# Patient Record
Sex: Female | Born: 1945
Health system: Southern US, Community
[De-identification: ages and names within clinical notes are randomized; demographics above are authoritative.]

## PROBLEM LIST (undated history)

## (undated) DIAGNOSIS — H409 Unspecified glaucoma: Secondary | ICD-10-CM

## (undated) DIAGNOSIS — R079 Chest pain, unspecified: Secondary | ICD-10-CM

## (undated) DIAGNOSIS — M810 Age-related osteoporosis without current pathological fracture: Secondary | ICD-10-CM

## (undated) DIAGNOSIS — M1990 Unspecified osteoarthritis, unspecified site: Secondary | ICD-10-CM

## (undated) DIAGNOSIS — M4150 Other secondary scoliosis, site unspecified: Secondary | ICD-10-CM

## (undated) DIAGNOSIS — E039 Hypothyroidism, unspecified: Secondary | ICD-10-CM

## (undated) DIAGNOSIS — Z86010 Personal history of colonic polyps: Secondary | ICD-10-CM

## (undated) DIAGNOSIS — E349 Endocrine disorder, unspecified: Secondary | ICD-10-CM

## (undated) DIAGNOSIS — M4800 Spinal stenosis, site unspecified: Secondary | ICD-10-CM

## (undated) DIAGNOSIS — I10 Essential (primary) hypertension: Secondary | ICD-10-CM

## (undated) DIAGNOSIS — H269 Unspecified cataract: Secondary | ICD-10-CM

## (undated) HISTORY — DX: Other secondary scoliosis, site unspecified: M41.50

## (undated) HISTORY — PX: TUBAL LIGATION: SHX77

## (undated) HISTORY — DX: Endocrine disorder, unspecified: E34.9

## (undated) HISTORY — DX: Unspecified osteoarthritis, unspecified site: M19.90

## (undated) HISTORY — DX: Essential (primary) hypertension: I10

## (undated) HISTORY — DX: Hypothyroidism, unspecified: E03.9

## (undated) HISTORY — DX: Chest pain, unspecified: R07.9

## (undated) HISTORY — DX: Unspecified glaucoma: H40.9

## (undated) HISTORY — DX: Spinal stenosis, site unspecified: M48.00

## (undated) HISTORY — DX: Age-related osteoporosis without current pathological fracture: M81.0

## (undated) HISTORY — DX: Unspecified cataract: H26.9

## (undated) HISTORY — DX: Personal history of colonic polyps: Z86.010

## (undated) HISTORY — PX: OTHER SURGICAL HISTORY: SHX169

## (undated) HISTORY — PX: TYMPANOSTOMY TUBE PLACEMENT: SHX32

---

## 1998-12-16 ENCOUNTER — Other Ambulatory Visit: Admission: RE | Admit: 1998-12-16 | Discharge: 1998-12-16 | Payer: Self-pay | Admitting: Obstetrics and Gynecology

## 1999-10-25 ENCOUNTER — Encounter: Admission: RE | Admit: 1999-10-25 | Discharge: 1999-10-25 | Payer: Self-pay | Admitting: Obstetrics and Gynecology

## 1999-10-25 ENCOUNTER — Encounter: Payer: Self-pay | Admitting: Obstetrics and Gynecology

## 2000-01-03 ENCOUNTER — Other Ambulatory Visit: Admission: RE | Admit: 2000-01-03 | Discharge: 2000-01-03 | Payer: Self-pay | Admitting: Obstetrics and Gynecology

## 2000-10-26 ENCOUNTER — Encounter: Admission: RE | Admit: 2000-10-26 | Discharge: 2000-10-26 | Payer: Self-pay | Admitting: Obstetrics and Gynecology

## 2000-10-26 ENCOUNTER — Encounter: Payer: Self-pay | Admitting: Obstetrics and Gynecology

## 2001-02-21 ENCOUNTER — Other Ambulatory Visit: Admission: RE | Admit: 2001-02-21 | Discharge: 2001-02-21 | Payer: Self-pay | Admitting: Obstetrics and Gynecology

## 2001-11-28 ENCOUNTER — Encounter: Admission: RE | Admit: 2001-11-28 | Discharge: 2001-11-28 | Payer: Self-pay | Admitting: Obstetrics and Gynecology

## 2001-11-28 ENCOUNTER — Encounter: Payer: Self-pay | Admitting: Obstetrics and Gynecology

## 2002-02-25 ENCOUNTER — Other Ambulatory Visit: Admission: RE | Admit: 2002-02-25 | Discharge: 2002-02-25 | Payer: Self-pay | Admitting: Obstetrics and Gynecology

## 2002-03-10 ENCOUNTER — Encounter: Payer: Self-pay | Admitting: Obstetrics and Gynecology

## 2002-03-10 ENCOUNTER — Encounter: Admission: RE | Admit: 2002-03-10 | Discharge: 2002-03-10 | Payer: Self-pay | Admitting: Obstetrics and Gynecology

## 2002-08-14 ENCOUNTER — Ambulatory Visit (HOSPITAL_BASED_OUTPATIENT_CLINIC_OR_DEPARTMENT_OTHER): Admission: RE | Admit: 2002-08-14 | Discharge: 2002-08-14 | Payer: Self-pay | Admitting: Orthopedic Surgery

## 2002-11-25 ENCOUNTER — Ambulatory Visit (HOSPITAL_BASED_OUTPATIENT_CLINIC_OR_DEPARTMENT_OTHER): Admission: RE | Admit: 2002-11-25 | Discharge: 2002-11-25 | Payer: Self-pay | Admitting: Orthopedic Surgery

## 2003-02-03 ENCOUNTER — Encounter: Admission: RE | Admit: 2003-02-03 | Discharge: 2003-02-03 | Payer: Self-pay | Admitting: Obstetrics and Gynecology

## 2003-02-03 ENCOUNTER — Encounter: Payer: Self-pay | Admitting: Obstetrics and Gynecology

## 2003-05-12 ENCOUNTER — Other Ambulatory Visit: Admission: RE | Admit: 2003-05-12 | Discharge: 2003-05-12 | Payer: Self-pay | Admitting: Obstetrics and Gynecology

## 2003-11-12 ENCOUNTER — Emergency Department (HOSPITAL_COMMUNITY): Admission: EM | Admit: 2003-11-12 | Discharge: 2003-11-12 | Payer: Self-pay | Admitting: Emergency Medicine

## 2004-02-25 ENCOUNTER — Encounter: Admission: RE | Admit: 2004-02-25 | Discharge: 2004-02-25 | Payer: Self-pay | Admitting: Obstetrics and Gynecology

## 2005-04-06 ENCOUNTER — Encounter: Admission: RE | Admit: 2005-04-06 | Discharge: 2005-04-06 | Payer: Self-pay | Admitting: Obstetrics and Gynecology

## 2006-05-23 ENCOUNTER — Encounter: Admission: RE | Admit: 2006-05-23 | Discharge: 2006-05-23 | Payer: Self-pay | Admitting: Obstetrics and Gynecology

## 2007-06-20 HISTORY — PX: COLONOSCOPY: SHX174

## 2007-06-24 ENCOUNTER — Encounter: Admission: RE | Admit: 2007-06-24 | Discharge: 2007-06-24 | Payer: Self-pay | Admitting: Obstetrics and Gynecology

## 2007-11-12 ENCOUNTER — Ambulatory Visit: Payer: Self-pay | Admitting: Internal Medicine

## 2007-11-25 ENCOUNTER — Ambulatory Visit: Payer: Self-pay | Admitting: Internal Medicine

## 2008-08-14 ENCOUNTER — Encounter: Admission: RE | Admit: 2008-08-14 | Discharge: 2008-08-14 | Payer: Self-pay | Admitting: Obstetrics and Gynecology

## 2008-12-08 ENCOUNTER — Encounter: Payer: Self-pay | Admitting: Cardiology

## 2009-04-08 ENCOUNTER — Encounter: Payer: Self-pay | Admitting: Internal Medicine

## 2009-05-26 ENCOUNTER — Ambulatory Visit: Payer: Self-pay | Admitting: Cardiology

## 2009-05-26 DIAGNOSIS — E039 Hypothyroidism, unspecified: Secondary | ICD-10-CM

## 2009-05-26 DIAGNOSIS — R079 Chest pain, unspecified: Secondary | ICD-10-CM

## 2009-05-26 HISTORY — DX: Chest pain, unspecified: R07.9

## 2009-05-26 HISTORY — DX: Hypothyroidism, unspecified: E03.9

## 2009-05-28 ENCOUNTER — Telehealth: Payer: Self-pay | Admitting: Cardiology

## 2009-06-16 ENCOUNTER — Ambulatory Visit: Payer: Self-pay | Admitting: Cardiovascular Disease

## 2009-06-16 ENCOUNTER — Ambulatory Visit: Payer: Self-pay

## 2009-06-16 ENCOUNTER — Ambulatory Visit (HOSPITAL_COMMUNITY): Admission: RE | Admit: 2009-06-16 | Discharge: 2009-06-16 | Payer: Self-pay | Admitting: Cardiology

## 2009-06-16 ENCOUNTER — Encounter: Payer: Self-pay | Admitting: Cardiology

## 2009-10-12 ENCOUNTER — Encounter: Admission: RE | Admit: 2009-10-12 | Discharge: 2009-10-12 | Payer: Self-pay | Admitting: Obstetrics and Gynecology

## 2010-02-28 ENCOUNTER — Encounter: Payer: Self-pay | Admitting: Cardiology

## 2010-07-19 NOTE — Procedures (Signed)
Summary: Colonosocpy   Colonoscopy  Procedure date:  11/25/2007  Findings:      Location:  [facility].    Procedures Next Due Date:    Colonoscopy: 11/2017  Patient Name: Alicia Cordova, Alicia Cordova MRN: [id number] Procedure Procedures: Colonoscopy CPT: 40981.  Personnel: Endoscopist: Iva Boop, MD, [facility].  Exam Location: Exam performed in Outpatient Clinic. Outpatient  Patient Consent: Procedure, Alternatives, Risks and Benefits discussed, consent obtained, from patient. Consent was obtained by the RN.  Indications  Average Risk Screening Routine.  History  Current Medications: Patient is not currently taking Coumadin.  Allergies: No known allergies.  Pre-Exam Physical: Performed Nov 25, 2007. Cardio-pulmonary exam, Rectal exam, HEENT exam , Abdominal exam, Mental status exam WNL.  Comments: Pt. history reviewed/updated, physical exam performed prior to initiation of sedation? YES Exam Exam: Extent of exam reached: Cecum, extent intended: Cecum.  The cecum was identified by appendiceal orifice and IC valve. Patient position: on left side. Time to Cecum: 00:03:28. Time for Withdrawl: 00:09:36. Colon retroflexion performed. Images taken. ASA Classification: II. Tolerance: excellent.  Monitoring: Pulse and BP monitoring, Oximetry used. Supplemental O2 given.  Colon Prep Used MiraLax for colon prep. Prep results: excellent.  Sedation Meds: Patient assessed and found to be appropriate for moderate (conscious) sedation. Fentanyl 50 mcg. given IV. Versed 5 mg. given IV.  Findings - NORMAL EXAM: Cecum to Sigmoid Colon.  DIVERTICULOSIS: Sigmoid Colon. Comments: minimal.  HEMORRHOIDS: External. Size: Grade I. Comments: very small.   Assessment  Comments: 1) MINIMAL SIGMOID DIVERTICULOSIS 2) VERY SMALL HEMORRHOIDS 3) OTHERWISE NORMAL WITH EXCELLENT PREP Events  Unplanned Interventions: No intervention was required.  Plans Patient Education:  Patient given standard instructions for: Diverticulosis. Hemorrhoids.  Disposition: After procedure patient sent to recovery. After recovery patient sent home.  Scheduling/Referral: Colonoscopy, ROUTINE IN 10 YRS,     cc.   Gretta Arab Griffin,MD   This report was created from the original endoscopy report, which was reviewed and signed by the above listed endoscopist.

## 2010-07-19 NOTE — Miscellaneous (Signed)
Summary: [facility] Previsit  Clinical Lists Changes  Medications: Added new medication of MIRALAX   POWD (POLYETHYLENE GLYCOL 3350) As per prep  instructions. - Signed Added new medication of METOCLOPRAMIDE HCL 10 MG  TABS (METOCLOPRAMIDE HCL) As per prep instructions. - Signed Rx of MIRALAX   POWD (POLYETHYLENE GLYCOL 3350) As per prep  instructions.;  #255gm x 0;  Signed;  Entered by: Wyona Almas RN;  Authorized by: Iva Boop MD;  Method used: Electronic Rx of METOCLOPRAMIDE HCL 10 MG  TABS (METOCLOPRAMIDE HCL) As per prep instructions.;  #2 x 0;  Signed;  Entered by: Wyona Almas RN;  Authorized by: Iva Boop MD;  Method used: Electronic Observations: Added new observation of NKA: T (11/12/2007 8:05)    Prescriptions: METOCLOPRAMIDE HCL 10 MG  TABS (METOCLOPRAMIDE HCL) As per prep instructions.  #2 x 0   Entered by:   Wyona Almas RN   Authorized by:   Iva Boop MD   Signed by:   Wyona Almas RN on 11/12/2007   Method used:   Electronically sent to ...       [facility]*       [address]       [place], [place]  [address]       Ph: [phone] or [phone]       Fax: [phone]   RxID:   [id number] MIRALAX   POWD (POLYETHYLENE GLYCOL 3350) As per prep  instructions.  #255gm x 0   Entered by:   Wyona Almas RN   Authorized by:   Iva Boop MD   Signed by:   Wyona Almas RN on 11/12/2007   Method used:   Electronically sent to ...       [facility]*       [address]       [place], [place]  [address]       Ph: [phone] or [phone]       Fax: [phone]   RxID:   [id number]

## 2010-07-19 NOTE — Assessment & Plan Note (Signed)
Summary: np6   Visit Type:  Initial Consult Primary Provider:  Maurice Small,  MD  CC:  chest pain.  History of Present Illness: the patient is a very healthy 65 year old female.  She is very active.  She exercises regularly.  She has a family history of coronary disease but not at a young age.  Her father had CABG at a later age.  She does not have any significant lipid abnormalities.  We are awaiting labs from her primary physician.  She is hypothyroid and takes medication.  Patient exercises every day.  She is in excellent physical condition.  Over time she has had episodes of chest discomfort.  They are random.  However when they occur, the symptoms can persist for 15-30 minutes.  The exact etiology is not clear.  There is no nausea vomiting or diaphoresis.  Preventive Screening-Counseling & Management  Alcohol-Tobacco     Smoking Status: never  Caffeine-Diet-Exercise     Does Patient Exercise: yes      Drug Use:  no.    Current Medications (verified): 1)  Synthroid 88 Mcg Tabs (Levothyroxine Sodium) .... Daily -- 6 Days A Week 2)  Multivitamins   Tabs (Multiple Vitamin) .... Once Daily 3)  Vitamin D 1000 Unit  Tabs (Cholecalciferol) .... Once Daily 4)  Fish Oil   Oil (Fish Oil) .... Once Daily 5)  Aspirin 81 Mg  Tabs (Aspirin) .... Once Daily  Allergies (verified): No Known Drug Allergies  Past History:  Family History: Last updated: 05/26/2009 Family History of Coronary Artery Disease:  Family History of Hyperlipidemia:  Family History of Hypertension:   Social History: Last updated: 05/26/2009 Full Time Single  Tobacco Use - No.  Alcohol Use - yes Regular Exercise - yes Drug Use - no  Past Medical History: CHEST PAIN-UNSPECIFIED (ICD-786.50)  hypothyroidism  Past Surgical History: wrist -- has a plate   Family History: Family History of Coronary Artery Disease:  Family History of Hyperlipidemia:  Family History of Hypertension:   Social History:  Full Time Single  Tobacco Use - No.  Alcohol Use - yes Regular Exercise - yes Drug Use - no Smoking Status:  never Does Patient Exercise:  yes Drug Use:  no  Review of Systems       The patient denies fever, chills, headache, sweats, rash, change in vision, change in hearing, shortness of breath, cough, nausea vomiting, urinary symptoms, musculoskeletal problems.  All of the systems are reviewed and are negative.  Vital Signs:  Patient profile:   65 year old female Height:      62 inches Weight:      128 pounds BMI:     23.50 Pulse rate:   60 / minute BP sitting:   132 / 72  (left arm) Cuff size:   regular  Vitals Entered By: Hardin Negus, RMA (May 26, 2009 4:20 PM)  Physical Exam  General:  patient is quite healthy in general Head:  head is atraumatic. Eyes:  no xanthelasma. Neck:  no jugular venous distention.  No carotid bruits. Chest Wall:  no chest wall tenderness. Lungs:  lungs are clear respiratory effort is nonlabored. Heart:  cardiac exam reveals S1-S2.  There no clicks or significant murmurs. Abdomen:  abdomen is soft. Msk:  no musculoskeletal deformities. Extremities:  no peripheral edema. Skin:  no skin rashes. Psych:  patient is oriented to person time and place.  Affect is normal.   Impression & Recommendations:  Problem # 1:  CHEST  PAIN-UNSPECIFIED (ICD-786.50)  Her updated medication list for this problem includes:    Aspirin 81 Mg Tabs (Aspirin) ..... Once daily  Orders: EKG w/ Interpretation (93000) Stress Echo (Stress Echo) Echocardiogram (Echo)  The patient has had chest discomfort.  EKG is done today and reviewed by me.  There is normal sinus rhythm with a normal QRS.  There is suggestion of biatrial enlargement by EKG criteria.  At this point further evaluation will include a full echo along with stress echo.  This will help me assess LV function and her bowels and her atria.  In addition we will rule out possible ischemia as the  basis of her chest pain.  I will not be starting any medications.  Problem # 2:  HYPOTHYROIDISM (ICD-244.9)  Her updated medication list for this problem includes:    Synthroid 88 Mcg Tabs (Levothyroxine sodium) .Marland Kitchen... Daily -- 6 days a week The patient is treated for her hypothyroidism.  No further workup is needed.  Problem # 3:  * BIATRIAL ENLARGEMENT BY EKG On physical exam I do not hear any valvular abnormalities.  Echo will be done to assess further.  Patient Instructions: 1)  Your physician has requested that you have an echocardiogram.  Echocardiography is a painless test that uses sound waves to create images of your heart. It provides your doctor with information about the size and shape of your heart and how well your heart's chambers and valves are working.  This procedure takes approximately one hour. There are no restrictions for this procedure. 2)  Your physician has requested that you have a stress echocardiogram. For further information please visit [email or web address]  Please follow instruction sheet as given. 3)  We will be in touch with via phone with your test results.

## 2010-07-19 NOTE — Miscellaneous (Signed)
  Clinical Lists Changes  Observations: Added new observation of PRIMARY MD: Maurice Small,  MD (05/26/2009 13:45)

## 2010-07-19 NOTE — Miscellaneous (Signed)
  Clinical Lists Changes  Observations: Added new observation of CARDIO HPI: labs sent to [place] by Dr.Dicktein:  collected October, 2010  Total cholesterol 190 LDL                     104 HDL                     79 Triglycerides       34 LDL particles      1007    this is low in good HDL particle size  35      this is high in good Small LDL             95      this is low in good LDL size              21.2    this is relatively large and relatively good  Overall these lipids are excellent.  Patient has no proven coronary disease.  No further treatment he (02/28/2010 15:39) Added new observation of VISIT TYPE: Chart update (02/28/2010 15:39) Added new observation of PRIMARY MD: Maurice Small,  MD (02/28/2010 15:39)      Visit Type:  Chart update Primary Provider:  Maurice Small,  MD   History of Present Illness: labs sent to [place] by Dr.Dicktein:  collected October, 2010  Total cholesterol 190 LDL                     104 HDL                     79 Triglycerides       34 LDL particles      1007    this is low in good HDL particle size  35      this is high in good Small LDL             95      this is low in good LDL size              21.2    this is relatively large and relatively good  Overall these lipids are excellent.  Patient has no proven coronary disease.  No further treatment he

## 2010-07-19 NOTE — Progress Notes (Signed)
Summary: returning call  Phone Note Call from Patient Call back at Work Phone [phone]   Caller: Patient Reason for Call: Talk to Nurse Summary of Call: returning call.... aware Herbert Seta is out till Monday, request call on Monday Initial call taken by: Migdalia Dk,  May 28, 2009 10:20 AM  Follow-up for Phone Call        spoke w/pt, she had labs w/her gyn Dr Rosalio Macadamia, they would not send me the labs w/out written consent from pt, she will give them a call to see if she can get them faxed to [place] Meredith Staggers, RN  May 31, 2009 2:14 PM

## 2010-11-04 NOTE — Op Note (Signed)
NAME:  Alicia Cordova, Alicia Cordova                       ACCOUNT NO.:  [id number]   MEDICAL RECORD NO.:  [id number]                   PATIENT TYPE:  AMB   LOCATION:  [facility]                                  FACILITY:  [facility]   PHYSICIAN:  Katy Fitch. Naaman Plummer., M.D.          DATE OF BIRTH:  Nov 20, 1945   DATE OF PROCEDURE:  11/25/2002  DATE OF DISCHARGE:                                 OPERATIVE REPORT   PREOPERATIVE DIAGNOSES:  Status post open reduction and internal fixation of  severely-comminuted interarticular Barton's fracture of left distal radius  with open reduction and internal fixation performed 12 weeks prior with  subsequent development of rupture of extensor pollicis longus tendon in  third dorsal compartment.   POSTOPERATIVE DIAGNOSES:  Attritional rupture of extensor pollicis longus  due to prominent lag screw with self-tapping thread edge in fourth/third  dorsal compartment.   OPERATIONS:  1. Exploration of second, third, and fourth dorsal compartments, left dorsal     wrist/dorsal radius.  2. Recession of titanium pegs x2 and recession of a titanium lag screw x1     utilizing a high-speed bur to remove titanium and to lower the profile of     the pegs and screws below the cortical bone level of the left distal     radius.  3. Transfer of extensor indicis proprius to extensor pollicis longus to     reconstruct left thumb extension.   OPERATING SURGEON:  Katy Fitch. Sypher, M.D.   ASSISTANT:  Jonni Sanger, P.A.   ANESTHESIA:  Axillary block supplemented by IV sedation.   SUPERVISING ANESTHESIOLOGIST:  Maren Beach, M.D.   INDICATIONS:  Pricella Gaugh is a 65 year old, very active, certified  public accountant, who three months prior sustained a severely-comminuted  interarticular fracture of her left distal radius.   She was referred for an upper extremity reconstructive consult.   After informed consent, she was brought to the operating room and underwent  open  reduction and internal fixation of her comminuted distal radius  fracture utilizing a seven-peg DVR plate system and a single 2-mm lag screw.   Care was taken during the placement of the plate and peg system as well as  the screw to use a C-arm fluoroscope for multiple views in an effort to  avoid proud hardware.   Due to the comminuted nature of the fracture, however, a lag screw was  required that by necessity needed to pierce the dorsal cortex of the radius  in the region of the fourth dorsal compartment.   At the time of placement of the lag screw, it appeared that the threads of  the screw were no more than 1 mm proud to obtain adequate purchase on the  dorsal cortex.   The screw was placed from an ulnar-proximal to radial-distal angle to allow  compression of the ulnar-sided volar fragment.  Anatomic reconstruction of  the distal radius was achieved.  Postoperative films demonstrated that a smooth peg was prominent in the  region of the second dorsal compartment; however, in my judgment, this did  not represent any type of risk for tendon rupture due to the nature of the  peg.   Ms. Langhorst proceeded to heal her fracture in a satisfactory manner and was  released to unrestricted activities one week prior to her extensor pollicis  longus rupture.   She returned to full level of activity, exercising including spinning  classes and utilizing stationary bicycles.   Approximately three days prior to surgery, she experienced a painful ache on  the dorsal aspect of her wrist and noted an immediate loss of extension of  her thumb.   She was seen on 11/24/02 for an urgent consultation and was noted to have an  obvious rupture of her extensor pollicis longus.   Oblique x-rays of her wrist were obtained which documented that there  appeared to be two pegs prominent between 1 and 2 mm due to probable bone  resorption and some subsidence of the fracture as it remodeled dorsally.   I  recommended immediate exploration of her wrist followed by peg recession,  EIP to EPL transfer to reconstruct the thumb extension.   After informed consent, she was brought to the operating room at this time.   PROCEDURE:  Wallis Spizzirri was brought to the operating room and placed in  the supine position on the operating table.   Following axillary block in the holding area, anesthesia was satisfactory in  the left arm. The left arm was prepped with Betadine soap and solution and  sterilely draped.  One gram of Ancef was administered as an IV prophylactic  antibiotic.   The procedure commenced with a 2-cm transverse incision at the level of  Lister's tubercle exposing the second, third, and fourth dorsal  compartments.  The retinaculum was split in the line of its fibers, and a  peak into the fourth dorsal compartment revealed some synovitis at the site  of a probable rupture of the extensor pollicis longus proximal to Lister's  tubercle.   The extensor tendons were retracted in an ulnar direction, and the minimal  tip of a threaded peg was noted piercing Lister's tubercle.   This did not appear to be in a position that would cause any tendon injury.   The second dorsal compartment was entered, and the extensor carpi radialis  longus and extensor carpi radialis brevis tendons retracted.  A smooth peg  was noted to be about 1-1.5 mm prominent.  This was recognized at the time  of surgery; however, due to the smooth nature of the peg, was not felt to be  an issue.   The fourth and second dorsal compartments were opened proximally with a  longitudinal extension of the transverse incision, and the tendons retracted  proximally to visualize all seven pegs.   The tip of the 2-mm lag screw was noted to be visible in the floor of the  fourth dorsal compartment adjacent to the proximal margin of Lister's tubercle, and it appeared that this, due to the sharp edge of the self-  tapping  thread was the culprit leading to the extensor tendon attrition.   We carefully inventoried all of the extensor tendons in the second, third,  and fourth dorsal compartments, and found EPL to be ruptured an the EIP and  all of the common extensors in the fourth dorsal compartment to be in normal  condition.  There was no sign of other attritional rupture.   An alternative explanation for this rupture could be a simple ischemic  rupture as is known to occur frequently with distal radius fractures.  However, my suspicion is that the self-tapping thread of the lag screw is  likely the etiology of this rupture.   I attempted to shorten the pegs utilizing a double-action rongeur.  The  titanium was clearly too stout to allow this strategy.  Therefore, a carbide  high-speed drill was obtained and was used to lower the profile of the lag  screw and the two pegs below the surface of the cortex.   The floor of the second, third, and fourth dorsal compartments were  carefully inspected, and no other prominent pegs could be identified.   The EIP to EPL transfer was then accomplished by identifying the extensor  indicis proprius in the fourth dorsal compartment, releasing it at the  sagittal bands overlying the MP joint, retracting the EIP proximally, and  rerouting it subcutaneously deep to the retinaculum and ulnar to Lister's  tubercle towards the stump of the extensor pollicis longus that was palpable  over the thumb metacarpal.   A third incision was fashioned at the site of the metacarpal neck of the  thumb, and a four-pass Pulvertaft weave was used to create a junction  between the extensor indicis proprius and the distal stump of the extensor  pollicis longus.   Corner sutures of 3-0 Ethibond were placed for a total of six sutures,  securing the four-pass Pulvertaft weave.   Care was taken to set the tension so that the thumb tip could contact the  ring fingertip at 30 degrees of  wrist dorsiflexion and headache full  extension with 30 degrees of wrist palmar flexion passively.   The tendon margins were smooth and inset.  The wounds were thoroughly  lavaged with sterile saline, and a meticulous debridement of titanium debris  was removed with sharp dissection, use of a rongeur, copious irrigation, and  formal resection of the wound margins.   A very small degree of titanium stained the periosteum and some subcutaneous  fat.   The skin margins were meticulously debrided of all titanium to prevent  tattooing.   The wounds were then closed with intradermal 0 Prolene suture.   Compressive dressing was applied with a volar plaster splint maintaining the  index MP joint in extension and full extension of the thumb with the wrist  in the neutral position.   There were no other complications noted during this procedure.  In retrospect, it would appear that the lag screw placement, which by  necessity required capture of the dorsal cortex was an unrecognized  complication of the initial procedure.   This is problematic with lag technique and in the future will require  perhaps use of a secondary plate rather than a lag screw technique.   There are otherwise no apparent complications.   Note for aftercare, she was given a prescription for Percocet 5 mg 1-2 p.o.  q.4-6 h. p.r.n. pain and also Keflex 500 mg 1 p.o. q.8 h. x4 days as a  prophylactic antibiotic.                                               Katy Fitch Naaman Plummer., M.D.    RVS/MEDQ  D:  11/25/2002  T:  11/25/2002  Job:  [id number]

## 2010-11-04 NOTE — Op Note (Signed)
NAME:  Alicia Cordova, Alicia Cordova                       ACCOUNT NO.:  [id number]   MEDICAL RECORD NO.:  [id number]                   PATIENT TYPE:  AMB   LOCATION:  DSC                                  FACILITY:  [facility]   PHYSICIAN:  Katy Fitch. Naaman Plummer., M.D.          DATE OF BIRTH:  Feb 26, 1946   DATE OF PROCEDURE:  08/14/2002  DATE OF DISCHARGE:                                 OPERATIVE REPORT   PREOPERATIVE DIAGNOSIS:  Status post severely comminuted  intra-articular  fracture of left distal radius and avulsion fracture of ulnar styloid.   POSTOPERATIVE DIAGNOSIS:  Status post severely comminuted  intra-articular  fracture of left distal radius and avulsion fracture of ulnar styloid.   OPERATION PERFORMED:  Open reduction internal fixation of left distal radius  complex intra-articular fracture with application of DVR seven-peg plate  system and interfragmentary screw fixation of a lunate facet fracture  fragment.   SURGEON:  Katy Fitch. Sypher, M.D.   ASSISTANT:  Jonni Sanger, P.A.   ANESTHESIA:  Infraclavicular block.   ANESTHESIOLOGIST:  Guadalupe Maple, M.D.   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  She was seen at the  [facility] walk-in clinic at [facility] where x-rays revealed her  comminuted fracture predicament.  She was splinted and initially referred to  Dr. Jodi Geralds at [facility].  However,  due to the fact that this is an extremely complex fracture and due to the  fact that she is acquainted with our practice, she chose an alternative  upper extremity orthopedic surgical venue for care.   Her past medical history is reviewed in detail.  She is a basically healthy  66 year old woman.  She did not report any neurovascular symptoms.  Preoperatively reviewed the  complexity of her fracture which involved an  unstable short oblique fracture of the lunate facet and intra-articular  fractures into the distal radius with severe comminution of the distal  radial metaphysis.  We recommended proceeding with placement of a  DVR volar  plate system and likely will require interfragmentary screw fixation.   She understands that we may use freeze dried allograft to augment her  fixation.  After informed consent she was brought to the operating room at  this time.   DESCRIPTION OF PROCEDURE:  The patient was brought to the operating room and  placed in supine position upon the operating table.  Following  infraclavicular block in the holding area, anesthesia was complete in the  left arm.  The arm was prepped with Betadine soap and solution and sterilely  draped.  1g of Ancef was administered as an IV prophylactic antibiotic.   Following exsanguination of the left arm with an Esmarch bandage, an  arterial tourniquet on the proximal brachium was inflated to  220 mmHg.  The  procedure commenced with an extended DVR incision paralleling the thenar  crease and extending 8 cm along the path of the flexor carpi radialis.  The  subcutaneous tissues were carefully divided taking care to identify and  gently retract the radial sensory branches and lateral brachio cutaneous  sensory branches.  The fascia overlying the flexor carpi radialis was split  followed by incision of the floor of the flexor carpi radialis tendon  sheath.  The flexor pollicis longus was retracted in an ulnar direction, the  radial artery identified and the pronator quadratus elevated sharply off of  the volar radius.  The fracture was immediately visualized.   This had a very comminuted volar cortex with two main fragments on the  articular surface.  The most complex portion of the fracture was a  malrotated fracture through the lunate facet.  Ultimately, we had to  disimpact the entire  fracture, rotate the shaft 90 degrees and clear clot  and use a Marketing executive to elevate the intra-  articular fracture fragments.  The metaphyseal oblique fragment of the  lunate facet was then secured with a lag screw of 2.5 mm titanium following  standard AO technique followed by application of a seven-peg DVR plate  system.   Anatomic reduction of the radius was achieved with a congruous articular  surface and restoration of proper slope, tilt and an ulnar negative variant.  The wound was lavaged thoroughly with sterile saline followed by repair of  the pronator quadratus with mattress suture of 2-0 Vicryl and repair of the  skin with subdermal suture of 3-0 Vicryl and intradermal 3-0 Prolene.   A voluminous gauze dressing was applied with sugar tong splint.  There were  no apparent complications.  The patient tolerated the surgery and anesthesia  well.  She was transferred to the recovery room with stable vital signs.                                               Katy Fitch Naaman Plummer., M.D.    RVS/MEDQ  D:  08/14/2002  T:  08/14/2002  Job:  [id number]   cc:   Willa Rough, M.D. [facility]

## 2010-11-30 ENCOUNTER — Other Ambulatory Visit: Payer: Self-pay | Admitting: Obstetrics and Gynecology

## 2010-11-30 DIAGNOSIS — Z1231 Encounter for screening mammogram for malignant neoplasm of breast: Secondary | ICD-10-CM

## 2010-12-28 ENCOUNTER — Ambulatory Visit
Admission: RE | Admit: 2010-12-28 | Discharge: 2010-12-28 | Disposition: A | Discharge status: Home / Self Care | Payer: BC Managed Care – PPO | Source: Ambulatory Visit | Attending: Obstetrics and Gynecology | Admitting: Obstetrics and Gynecology

## 2010-12-28 ENCOUNTER — Other Ambulatory Visit: Payer: Self-pay | Admitting: Obstetrics & Gynecology

## 2010-12-28 DIAGNOSIS — Z1231 Encounter for screening mammogram for malignant neoplasm of breast: Secondary | ICD-10-CM

## 2016-10-04 ENCOUNTER — Ambulatory Visit (INDEPENDENT_AMBULATORY_CARE_PROVIDER_SITE_OTHER): Payer: Managed Care, Other (non HMO) | Admitting: Obstetrics & Gynecology

## 2016-10-04 ENCOUNTER — Encounter: Payer: Self-pay | Admitting: Obstetrics & Gynecology

## 2016-10-04 VITALS — BP 132/78 | Ht 61.75 in | Wt 129.0 lb

## 2016-10-04 DIAGNOSIS — N952 Postmenopausal atrophic vaginitis: Secondary | ICD-10-CM

## 2016-10-04 DIAGNOSIS — Z01411 Encounter for gynecological examination (general) (routine) with abnormal findings: Secondary | ICD-10-CM | POA: Diagnosis not present

## 2016-10-04 DIAGNOSIS — M810 Age-related osteoporosis without current pathological fracture: Secondary | ICD-10-CM

## 2016-10-04 NOTE — Patient Instructions (Signed)
Your Annual/Gyn exam was normal today except for menopausal atrophic vaginitis, which is not symptomatic.  A Pap reflex was done and I will give you the results as soon as available.  As you know, you have Osteoporosis per last BD, which was stable.  Given that Actonel gave you a rash recently, we will not start a treatment plan today.  Reclast and Prolia were briefly discussed and a pamphlet on Prolia was provided.  Vit D was drawn.  You will discuss Osteoporosis management at your coming Endocrinologist visit.  Your next screening Mammo is in the fall, I would like to add the report to your chart here.  It was a pleasure to see you today.

## 2016-10-04 NOTE — Progress Notes (Signed)
Alicia Cordova [birth year] [id number]   History:    71 y.o.  for annual gyn exam.  Established patient, Abstinent, Menopause.  No HRT.  No PMB.  No pelvic pain.  No abnormal d/c.  No SUI.  Last BD recently, Stable Osteoporosis.  Tried Actonel, but developed a rash and stopped it.  Very physically active.  On Vit D supplement, will check level today.  Breasts wnl.  Fam MD Dr Laurann Montana.  Endocrino referral planned soon for Hypothyroidism and will discuss Osteoporosis as well.  Past medical history,surgical history, family history and social history were all reviewed and documented in the EPIC chart.  Gynecologic History No LMP recorded. Patient is postmenopausal. Contraception: abstinence Last Pap: 2015. Results were: normal Last mammogram: fall 2017. Results were: normal  Obstetric History OB History  Gravida Para Term Preterm AB Living  2 2       2   SAB TAB Ectopic Multiple Live Births               # Outcome Date GA Lbr Len/2nd Weight Sex Delivery Anes PTL Lv  2 Para           1 Para                ROS: A ROS was performed and pertinent positives and negatives are included in the history.  GENERAL: No fevers or chills. HEENT: No change in vision, no earache, sore throat or sinus congestion. NECK: No pain or stiffness. CARDIOVASCULAR: No chest pain or pressure. No palpitations. PULMONARY: No shortness of breath, cough or wheeze. GASTROINTESTINAL: No abdominal pain, nausea, vomiting or diarrhea, melena or bright red blood per rectum. GENITOURINARY: No urinary frequency, urgency, hesitancy or dysuria. MUSCULOSKELETAL: No joint or muscle pain, no back pain, no recent trauma. DERMATOLOGIC: No rash, no itching, no lesions. ENDOCRINE: No polyuria, polydipsia, no heat or cold intolerance. No recent change in weight. HEMATOLOGICAL: No anemia or easy bruising or bleeding. NEUROLOGIC: No headache, seizures, numbness, tingling or weakness. PSYCHIATRIC: No depression, no loss of interest in normal  activity or change in sleep pattern.     Exam:   BP 132/78   Ht 5' 1.75" (1.568 m)   Wt 129 lb (58.5 kg)   BMI 23.79 kg/m   Body mass index is 23.79 kg/m.  General appearance : Well developed well nourished female. No acute distress HEENT: Eyes: no retinal hemorrhage or exudates,  Neck supple, trachea midline, no carotid bruits, no thyroidmegaly Lungs: Clear to auscultation, no rhonchi or wheezes, or rib retractions  Heart: Regular rate and rhythm, no murmurs or gallops Breast:Examined in sitting and supine position were symmetrical in appearance, no palpable masses or tenderness,  no skin retraction, no nipple inversion, no nipple discharge, no skin discoloration, no axillary or supraclavicular lymphadenopathy Abdomen: no palpable masses or tenderness, no rebound or guarding Extremities: no edema or skin discoloration or tenderness  Pelvic:  Bartholin, Urethra, Skene Glands: Within normal limits             Vagina: No gross lesions or discharge.  Very thin vaginal mucosa.  Cervix: No gross lesions or discharge.  Pap done.  Uterus  RV, normal size, shape and consistency, non-tender and mobile  Adnexa  Without masses or tenderness  Anus and perineum  normal     Assessment/Plan:  71 y.o. female for annual exam.  1. Encounter for gynecological examination (general) (routine) with abnormal findings Normal Aex/Gyn exam except Atrophic Vaginitis.  Pap reflex done.  Will do screening Mammo fall 2018.  2. Post-menopausal atrophic vaginitis Asymptomatic.  Abstinent  3. Age-related osteoporosis without current pathological fracture  - Vitamin D 1,25 dihydroxy  Your Annual/Gyn exam was normal today except for menopausal atrophic vaginitis, which is not symptomatic.  A Pap reflex was done and I will give you the results as soon as available.  As you know, you have Osteoporosis per last BD, which was stable.  Given that Actonel gave you a rash recently, we will not start a treatment  plan today.  Reclast and Prolia were briefly discussed and a pamphlet on Prolia was provided.  Vit D was drawn.  You will discuss Osteoporosis management at your coming Endocrinologist visit.  Your next screening Mammo is in the fall, I would like to add the report to your chart here.  It was a pleasure to see you today.  Counseling on above >50% x 10 min  Princess Bruins MD, 9:46 AM 10/04/2016

## 2016-10-04 NOTE — Addendum Note (Signed)
Addended by: Thurnell Garbe A on: 10/04/2016 12:23 PM   Modules accepted: Orders

## 2016-10-05 LAB — PAP IG W/ RFLX HPV ASCU

## 2016-10-07 LAB — VITAMIN D 1,25 DIHYDROXY
Vitamin D 1, 25 (OH)2 Total: 49 pg/mL (ref 18–72)
Vitamin D2 1, 25 (OH)2: <8 pg/mL
Vitamin D3 1, 25 (OH)2: 49 pg/mL

## 2016-10-18 ENCOUNTER — Other Ambulatory Visit: Payer: Self-pay | Admitting: Endocrinology

## 2016-10-18 DIAGNOSIS — E041 Nontoxic single thyroid nodule: Secondary | ICD-10-CM

## 2016-10-25 ENCOUNTER — Ambulatory Visit
Admission: RE | Admit: 2016-10-25 | Discharge: 2016-10-25 | Disposition: A | Discharge status: Home / Self Care | Payer: Managed Care, Other (non HMO) | Source: Ambulatory Visit | Attending: Endocrinology | Admitting: Endocrinology

## 2016-10-25 DIAGNOSIS — E041 Nontoxic single thyroid nodule: Secondary | ICD-10-CM

## 2017-04-16 DIAGNOSIS — E038 Other specified hypothyroidism: Secondary | ICD-10-CM | POA: Diagnosis not present

## 2017-04-16 DIAGNOSIS — M810 Age-related osteoporosis without current pathological fracture: Secondary | ICD-10-CM | POA: Diagnosis not present

## 2017-04-16 DIAGNOSIS — E041 Nontoxic single thyroid nodule: Secondary | ICD-10-CM | POA: Diagnosis not present

## 2017-04-16 DIAGNOSIS — Z6823 Body mass index (BMI) 23.0-23.9, adult: Secondary | ICD-10-CM | POA: Diagnosis not present

## 2017-05-01 DIAGNOSIS — Z1231 Encounter for screening mammogram for malignant neoplasm of breast: Secondary | ICD-10-CM | POA: Diagnosis not present

## 2017-05-21 DIAGNOSIS — Z0000 Encounter for general adult medical examination without abnormal findings: Secondary | ICD-10-CM | POA: Diagnosis not present

## 2017-05-21 DIAGNOSIS — E039 Hypothyroidism, unspecified: Secondary | ICD-10-CM | POA: Diagnosis not present

## 2017-05-21 DIAGNOSIS — I10 Essential (primary) hypertension: Secondary | ICD-10-CM | POA: Diagnosis not present

## 2017-05-21 DIAGNOSIS — Z136 Encounter for screening for cardiovascular disorders: Secondary | ICD-10-CM | POA: Diagnosis not present

## 2017-05-31 DIAGNOSIS — H903 Sensorineural hearing loss, bilateral: Secondary | ICD-10-CM | POA: Diagnosis not present

## 2017-05-31 DIAGNOSIS — H8103 Meniere's disease, bilateral: Secondary | ICD-10-CM | POA: Diagnosis not present

## 2017-07-13 ENCOUNTER — Encounter (HOSPITAL_COMMUNITY): Payer: Managed Care, Other (non HMO)

## 2017-07-23 ENCOUNTER — Other Ambulatory Visit (HOSPITAL_COMMUNITY): Payer: Self-pay | Admitting: *Deleted

## 2017-07-24 ENCOUNTER — Ambulatory Visit (HOSPITAL_COMMUNITY)
Admission: RE | Admit: 2017-07-24 | Discharge: 2017-07-24 | Disposition: A | Discharge status: Home / Self Care | Payer: Medicare Other | Source: Ambulatory Visit | Attending: Endocrinology | Admitting: Endocrinology

## 2017-07-24 DIAGNOSIS — M810 Age-related osteoporosis without current pathological fracture: Secondary | ICD-10-CM | POA: Diagnosis not present

## 2017-07-24 MED ORDER — DENOSUMAB 60 MG/ML ~~LOC~~ SOLN
60.0000 mg | Freq: Once | SUBCUTANEOUS | Status: AC
  Administered 2017-07-24: 60 mg via SUBCUTANEOUS
  Filled 2017-07-24: qty 1

## 2017-07-27 DIAGNOSIS — G43909 Migraine, unspecified, not intractable, without status migrainosus: Secondary | ICD-10-CM | POA: Diagnosis not present

## 2017-07-27 DIAGNOSIS — H25811 Combined forms of age-related cataract, right eye: Secondary | ICD-10-CM | POA: Diagnosis not present

## 2017-07-27 DIAGNOSIS — H43811 Vitreous degeneration, right eye: Secondary | ICD-10-CM | POA: Diagnosis not present

## 2017-08-14 DIAGNOSIS — H401131 Primary open-angle glaucoma, bilateral, mild stage: Secondary | ICD-10-CM | POA: Diagnosis not present

## 2017-08-14 DIAGNOSIS — H43811 Vitreous degeneration, right eye: Secondary | ICD-10-CM | POA: Diagnosis not present

## 2017-10-10 ENCOUNTER — Ambulatory Visit: Payer: BLUE CROSS/BLUE SHIELD | Admitting: Obstetrics & Gynecology

## 2017-10-10 ENCOUNTER — Telehealth: Payer: Self-pay | Admitting: Gynecology

## 2017-10-10 ENCOUNTER — Encounter: Payer: Self-pay | Admitting: Obstetrics & Gynecology

## 2017-10-10 VITALS — BP 122/76 | Ht 61.75 in | Wt 132.0 lb

## 2017-10-10 DIAGNOSIS — Z780 Asymptomatic menopausal state: Secondary | ICD-10-CM

## 2017-10-10 DIAGNOSIS — M810 Age-related osteoporosis without current pathological fracture: Secondary | ICD-10-CM

## 2017-10-10 DIAGNOSIS — Z01419 Encounter for gynecological examination (general) (routine) without abnormal findings: Secondary | ICD-10-CM

## 2017-10-10 MED ORDER — DENOSUMAB 60 MG/ML ~~LOC~~ SOSY: 60.0000 mg | PREFILLED_SYRINGE | SUBCUTANEOUS | 1 refills | Status: AC

## 2017-10-10 NOTE — Progress Notes (Signed)
Alicia Cordova 06/01/1946 [id number]   History:    72 y.o. G2P2L2  Widowed.  Traveling, did the [place] trip.  RP:  Established patient presenting for annual gyn exam   HPI: Menopause, well on no hormone replacement therapy.  No postmenopausal bleeding.  No pelvic pain.  Abstinent.  Urine and bowel movements normal.  Breasts normal.  Body mass index 24.34.  Physically active regularly.  Osteoporosis on Prolia.  Taking vitamin D supplements, normal vitamin D level April 2018.  Followed by Dr. Forde Dandy for hypothyroidism and osteoporosis.  Will repeat bone density and screening mammogram at [facility] November 2019.  Past medical history,surgical history, family history and social history were all reviewed and documented in the EPIC chart.  Gynecologic History No LMP recorded. Patient is postmenopausal. Contraception: abstinence and post menopausal status Last Pap: 09/2016. Results were: Negative Last mammogram: 04/2017. Results were: Negative Bone Density: 03/2016 Bilateral femoral necks Osteoporosis T-Score -2.8.  On Prolia x 1 year, 2 doses received. Colonoscopy: 2009     Obstetric History OB History  Gravida Para Term Preterm AB Living  2 2       2   SAB TAB Ectopic Multiple Live Births               # Outcome Date GA Lbr Len/2nd Weight Sex Delivery Anes PTL Lv  2 Para           1 Para              ROS: A ROS was performed and pertinent positives and negatives are included in the history.  GENERAL: No fevers or chills. HEENT: No change in vision, no earache, sore throat or sinus congestion. NECK: No pain or stiffness. CARDIOVASCULAR: No chest pain or pressure. No palpitations. PULMONARY: No shortness of breath, cough or wheeze. GASTROINTESTINAL: No abdominal pain, nausea, vomiting or diarrhea, melena or bright red blood per rectum. GENITOURINARY: No urinary frequency, urgency, hesitancy or dysuria. MUSCULOSKELETAL: No joint or muscle pain, no back pain, no recent trauma.  DERMATOLOGIC: No rash, no itching, no lesions. ENDOCRINE: No polyuria, polydipsia, no heat or cold intolerance. No recent change in weight. HEMATOLOGICAL: No anemia or easy bruising or bleeding. NEUROLOGIC: No headache, seizures, numbness, tingling or weakness. PSYCHIATRIC: No depression, no loss of interest in normal activity or change in sleep pattern.     Exam:   BP 122/76   Ht 5' 1.75" (1.568 m)   Wt 132 lb (59.9 kg)   BMI 24.34 kg/m   Body mass index is 24.34 kg/m.  General appearance : Well developed well nourished female. No acute distress HEENT: Eyes: no retinal hemorrhage or exudates,  Neck supple, trachea midline, no carotid bruits, no thyroidmegaly Lungs: Clear to auscultation, no rhonchi or wheezes, or rib retractions  Heart: Regular rate and rhythm, no murmurs or gallops Breast:Examined in sitting and supine position were symmetrical in appearance, no palpable masses or tenderness,  no skin retraction, no nipple inversion, no nipple discharge, no skin discoloration, no axillary or supraclavicular lymphadenopathy Abdomen: no palpable masses or tenderness, no rebound or guarding Extremities: no edema or skin discoloration or tenderness  Pelvic: Vulva: Normal             Vagina: No gross lesions or discharge  Cervix: No gross lesions or discharge  Uterus  AV, normal size, shape and consistency, non-tender and mobile  Adnexa  Without masses or tenderness  Anus: Normal   Assessment/Plan:  72 y.o. female for annual exam  1. Well female exam with routine gynecological exam Normal gynecologic exam and menopause.  Pap reflex -April 2018.  Breast exam normal.  Will schedule screening mammogram at [facility] in November 2019.  Due for a colonoscopy this year.  Health labs with Dr. Laurann Montana.  Hypothyroidism and osteoporosis followed by Dr. Forde Dandy.  2. Menopause present Well on no hormone replacement therapy.  No postmenopausal bleeding.  3. Age-related osteoporosis without current  pathological fracture Osteoporosis with a T score of -2.8 on last bone density October 2017.  On Prolia for 1 year, received 2 doses with the last one February 2019.  Will come here early August for the third dose.  Will make sure that she has a calcium level result within a year with Dr. Laurann Montana.  Taking vitamin D supplements and having a calcium rich nutrition.  Recommend continuing with regular weightbearing physical activity.  Will repeat bone density at [facility] in November 2019.  Other orders - denosumab (PROLIA) 60 MG/ML SOSY injection; Inject 60 mg into the skin every 6 (six) months.  Princess Bruins MD, 9:08 AM 10/10/2017

## 2017-10-10 NOTE — Patient Instructions (Signed)
1. Well female exam with routine gynecological exam Normal gynecologic exam and menopause.  Pap reflex -April 2018.  Breast exam normal.  Will schedule screening mammogram at [facility] in November 2019.  Due for a colonoscopy this year.  Health labs with Dr. Laurann Montana.  Hypothyroidism and osteoporosis followed by Dr. Forde Dandy.  2. Menopause present Well on no hormone replacement therapy.  No postmenopausal bleeding.  3. Age-related osteoporosis without current pathological fracture Osteoporosis with a T score of -2.8 on last bone density October 2017.  On Prolia for 1 year, received 2 doses with the last one February 2019.  Will come here early August for the third dose.  Will make sure that she has a calcium level result within a year with Dr. Laurann Montana.  Taking vitamin D supplements and having a calcium rich nutrition.  Recommend continuing with regular weightbearing physical activity.  Will repeat bone density at [facility] in November 2019.  Other orders - denosumab (PROLIA) 60 MG/ML SOSY injection; Inject 60 mg into the skin every 6 (six) months.  Erasmo Downer, good seeing you today!

## 2017-10-10 NOTE — Telephone Encounter (Signed)
Osteoporosis. Prolia x 1 year, last dose 07/2017. Wants to continue with Prolia injections here. Due early 01/2018. Prescription sent to pharmacy. Ca++ level done with Dr Rudy Jew MD   Note from Dr Dellis Filbert.   Message to [employer]

## 2017-10-13 NOTE — Addendum Note (Signed)
Addended by: Alen Blew on: 10/13/2017 09:31 PM   Modules accepted: Level of Service

## 2017-10-15 NOTE — Telephone Encounter (Signed)
Talked to [facility] at [facility] regarding [place] sent to pharmacy , she will put a note on this that Prolia is filled and ordered thru [place] for patient. Next Prolia will be due in  01/2018

## 2017-11-01 NOTE — Telephone Encounter (Signed)
Talked with Alicia Cordova and she has patient on her recall list.

## 2017-11-21 DIAGNOSIS — E039 Hypothyroidism, unspecified: Secondary | ICD-10-CM | POA: Diagnosis not present

## 2017-11-21 DIAGNOSIS — H8109 Meniere's disease, unspecified ear: Secondary | ICD-10-CM | POA: Diagnosis not present

## 2017-11-21 DIAGNOSIS — M810 Age-related osteoporosis without current pathological fracture: Secondary | ICD-10-CM | POA: Diagnosis not present

## 2017-12-06 ENCOUNTER — Telehealth: Payer: Self-pay | Admitting: *Deleted

## 2017-12-06 NOTE — Telephone Encounter (Signed)
Spoke with pt explained what happened. As well as told her we will call her in July regarding Prolia

## 2017-12-06 NOTE — Telephone Encounter (Signed)
Pt called asking about Prolia being sent to her pharmacy . I left a message asking her to call  Me back pertaining to Prolia Prolia will be given and supplied at [facility] this was a error on our part we will contact her regarding next injection around July which is a month before she's due

## 2018-01-18 ENCOUNTER — Telehealth: Payer: Self-pay | Admitting: *Deleted

## 2018-01-18 NOTE — Telephone Encounter (Signed)
Prolia insurance verification has been sent awaiting Summary of benefits  

## 2018-01-28 NOTE — Telephone Encounter (Addendum)
Deductible $3000($[id number])  OOP MAX $0982($8675)  Annual exam 10/10/17 ML  Calcium 9.8            Date 01/30/18  Upcoming dental procedures NO  Prior Authorization needed YES approved 01/24/18 to 01/24/2019  Pt estimated Cost $224 (Pt states she has a Prolia Card shes not sure how much money is on it but will use this card to pay)  Appt 02/01/18 at 8:30   Left voice mail  Coverage Details: 20% of One dose, 20% of admin fee

## 2018-01-29 ENCOUNTER — Other Ambulatory Visit: Payer: Self-pay | Admitting: Obstetrics & Gynecology

## 2018-01-29 DIAGNOSIS — M810 Age-related osteoporosis without current pathological fracture: Secondary | ICD-10-CM

## 2018-01-30 ENCOUNTER — Other Ambulatory Visit: Payer: BLUE CROSS/BLUE SHIELD

## 2018-01-30 DIAGNOSIS — M810 Age-related osteoporosis without current pathological fracture: Secondary | ICD-10-CM | POA: Diagnosis not present

## 2018-01-30 LAB — CALCIUM: Calcium: 9.8 mg/dL (ref 8.6–10.4)

## 2018-02-01 ENCOUNTER — Ambulatory Visit (INDEPENDENT_AMBULATORY_CARE_PROVIDER_SITE_OTHER): Payer: BLUE CROSS/BLUE SHIELD | Admitting: Anesthesiology

## 2018-02-01 DIAGNOSIS — M810 Age-related osteoporosis without current pathological fracture: Secondary | ICD-10-CM

## 2018-02-01 MED ORDER — DENOSUMAB 60 MG/ML ~~LOC~~ SOSY
60.0000 mg | PREFILLED_SYRINGE | Freq: Once | SUBCUTANEOUS | Status: AC
  Administered 2018-02-01: 60 mg via SUBCUTANEOUS

## 2018-02-04 ENCOUNTER — Encounter: Payer: Self-pay | Admitting: Internal Medicine

## 2018-02-14 NOTE — Telephone Encounter (Signed)
PROLIA GIVEN 02/01/18 NEXT INJECTION 08/05/2018

## 2018-02-22 DIAGNOSIS — H401131 Primary open-angle glaucoma, bilateral, mild stage: Secondary | ICD-10-CM | POA: Diagnosis not present

## 2018-02-22 DIAGNOSIS — H5213 Myopia, bilateral: Secondary | ICD-10-CM | POA: Diagnosis not present

## 2018-03-05 ENCOUNTER — Encounter: Payer: Self-pay | Admitting: *Deleted

## 2018-03-11 ENCOUNTER — Encounter: Payer: Self-pay | Admitting: Internal Medicine

## 2018-03-18 ENCOUNTER — Encounter: Payer: Self-pay | Admitting: Obstetrics & Gynecology

## 2018-04-19 ENCOUNTER — Encounter: Payer: Self-pay | Admitting: Internal Medicine

## 2018-04-19 ENCOUNTER — Ambulatory Visit (AMBULATORY_SURGERY_CENTER): Payer: Self-pay

## 2018-04-19 VITALS — Ht 62.0 in | Wt 130.2 lb

## 2018-04-19 DIAGNOSIS — Z86010 Personal history of colonic polyps: Secondary | ICD-10-CM

## 2018-04-19 DIAGNOSIS — Z1211 Encounter for screening for malignant neoplasm of colon: Secondary | ICD-10-CM

## 2018-04-19 DIAGNOSIS — Z860101 Personal history of adenomatous and serrated colon polyps: Secondary | ICD-10-CM

## 2018-04-19 HISTORY — DX: Personal history of adenomatous and serrated colon polyps: Z86.0101

## 2018-04-19 HISTORY — DX: Personal history of colonic polyps: Z86.010

## 2018-04-19 NOTE — Progress Notes (Signed)
Denies allergies to eggs or soy products. Denies complication of anesthesia or sedation. Denies use of weight loss medication. Denies use of O2.   Emmi instructions declined.  

## 2018-04-22 ENCOUNTER — Encounter: Payer: Self-pay | Admitting: Internal Medicine

## 2018-04-22 ENCOUNTER — Ambulatory Visit (INDEPENDENT_AMBULATORY_CARE_PROVIDER_SITE_OTHER): Payer: BLUE CROSS/BLUE SHIELD | Admitting: Internal Medicine

## 2018-04-22 VITALS — BP 128/78 | HR 47 | Ht 62.0 in | Wt 132.0 lb

## 2018-04-22 DIAGNOSIS — I10 Essential (primary) hypertension: Secondary | ICD-10-CM

## 2018-04-22 NOTE — Progress Notes (Signed)
Cardiology Office Note   Date:  04/22/2018   ID:  Alicia Cordova, DOB 08-29-1945, MRN [id number]  PCP:  Kelton Pillar, MD  Cardiologist:   Dorris Carnes, MD   Patient self referred for cardiac risk stratification    History of Present Illness: Alicia Cordova is a 72 y.o. female with a history of CAD in 42   Husband died 88   MI  Since then she has been concerned more about heart health She was seen by Veatrice Bourbon in the past    Pt works out at gym    Early am  5x per week and on weekend walks a lot     2 days does body pump   2days spins 1 day body flow   Occasionally has epigastirc pain that radiates to R ear   Rare  Occurs 1-2 x per year  Not associated with activity Occasional L leg pain     FHx  Dad  CAD   53s   Mom CHF in 90s     Current Meds  Medication Sig  . BIOTIN 5000 PO Take 5,000 mcg by mouth daily.  . calcium carbonate (CALCIUM 600) 600 MG TABS tablet Take 600 mg by mouth 2 (two) times daily with a meal.  . cholecalciferol (VITAMIN D) 1000 units tablet Take 1,000 Units by mouth daily.  . Cranberry 1000 MG CAPS Take by mouth.  . denosumab (PROLIA) 60 MG/ML SOSY injection Inject 60 mg into the skin every 6 (six) months.  Marland Kitchen glucosamine-chondroitin 500-400 MG tablet Take 1 tablet by mouth daily.   Marland Kitchen levothyroxine (SYNTHROID, LEVOTHROID) 75 MCG tablet Take 75 mcg by mouth daily before breakfast.  . Multiple Vitamin (MULTIVITAMIN) tablet Take 1 tablet by mouth daily.  . polyethylene glycol powder (GLYCOLAX/MIRALAX) powder Take 1 Container by mouth once.  . timolol (TIMOPTIC) 0.5 % ophthalmic solution Place 1 drop into both eyes 2 (two) times daily.   Marland Kitchen triamterene-hydrochlorothiazide (DYAZIDE) 37.5-25 MG capsule Take 1 capsule by mouth every other day.  . TURMERIC PO Take by mouth.     Allergies:   Lisinopril   Past Medical History:  Diagnosis Date  . Cataract   . CHEST PAIN-UNSPECIFIED 05/26/2009   Qualifier: Diagnosis of  By: Mare Ferrari, RMA, Sherri      . Glaucoma   . Hormone disorder   . Hypertension   . HYPOTHYROIDISM 05/26/2009   Qualifier: Diagnosis of  By: Ron Parker, MD, Leonidas Romberg Dorinda Hill   . Osteoporosis     Past Surgical History:  Procedure Laterality Date  . arm surgery     metal plate in left arm  . TUBAL LIGATION    . TYMPANOSTOMY TUBE PLACEMENT       Social History:  The patient  reports that she has never smoked. She has never used smokeless tobacco. She reports that she drinks alcohol. She reports that she does not use drugs.   Family History:  The patient's family history includes Cancer in her mother; Colon cancer in her maternal aunt; Congestive Heart Failure in her mother; Stroke in her mother.    ROS:  Please see the history of present illness. All other systems are reviewed and  Negative to the above problem except as noted.    PHYSICAL EXAM: VS:  BP 128/78   Pulse (!) 47   Ht 5\' 2"  (1.575 m)   Wt 132 lb (59.9 kg)   BMI 24.14 kg/m   GEN: Well nourished, well developed, in  no acute distress  HEENT: normal  Neck: no JVD, carotid bruits, or masses Cardiac: RRR; no murmurs, rubs, or gallops,no edema  Respiratory:  clear to auscultation bilaterally, normal work of breathing GI: soft, nontender, nondistended, + BS  No hepatomegaly  MS: no deformity Moving all extremities   Skin: warm and dry, no rash Neuro:  Strength and sensation are intact Psych: euthymic mood, full affect   EKG:  EKG is ordered today.  SB   47 bpm   Lipid Panel No results found for: CHOL, TRIG, HDL, CHOLHDL, VLDL, LDLCALC, LDLDIRECT    Wt Readings from Last 3 Encounters:  04/22/18 132 lb (59.9 kg)  04/19/18 130 lb 3.2 oz (59.1 kg)  10/10/17 132 lb (59.9 kg)      ASSESSMENT AND PLAN:  1  Cardiac risk stratification.   Pt is extremely active   I do not sense anything concerning for angina in her hsitory   EKG with SB   HR low probably because trained   She deneis dizzienss BP is controlled   Last liipds LDL 105   HDL 90  I  encouraged her to stay active   I would not push intervention/eval further  2   BP   Pressure is good today   Keep on same meds    F/U in 2 years   Sooner if symptoms develop.  Current medicines are reviewed at length with the patient today.  The patient does not have concerns regarding medicines.  Signed, Dorris Carnes, MD  04/22/2018 9:10 AM    [address], [place], [place]  [id number] Phone: [phone]; Fax: [phone]

## 2018-04-22 NOTE — Patient Instructions (Signed)
Medication Instructions:  Your physician recommends that you continue on your current medications as directed. Please refer to the Current Medication list given to you today.  If you need a refill on your cardiac medications before your next appointment, please call your pharmacy.   Lab work: none If you have labs (blood work) drawn today and your tests are completely normal, you will receive your results only by: Marland Kitchen MyChart Message (if you have MyChart) OR . A paper copy in the mail If you have any lab test that is abnormal or we need to change your treatment, we will call you to review the results.  Testing/Procedures: none  Follow-Up: At [facility], you and your health needs are our priority.  As part of our continuing mission to provide you with exceptional heart care, we have created designated Provider Care Teams.  These Care Teams include your primary Cardiologist (physician) and Advanced Practice Providers (APPs -  Physician Assistants and Nurse Practitioners) who all work together to provide you with the care you need, when you need it. You will need a follow up appointment in:  2 years.  Please call our office 2 months in advance to schedule this appointment.  You may see Dorris Carnes, MD or one of the following Advanced Practice Providers on your designated Care Team: Richardson Dopp, PA-C [place], [place] . Daune Perch, NP  Any Other Special Instructions Will Be Listed Below (If Applicable).

## 2018-05-03 ENCOUNTER — Ambulatory Visit (AMBULATORY_SURGERY_CENTER): Payer: BLUE CROSS/BLUE SHIELD | Admitting: Internal Medicine

## 2018-05-03 ENCOUNTER — Encounter: Payer: Self-pay | Admitting: Internal Medicine

## 2018-05-03 VITALS — BP 104/59 | HR 45 | Temp 98.4°F | Resp 10 | Ht 62.0 in | Wt 132.0 lb

## 2018-05-03 DIAGNOSIS — D122 Benign neoplasm of ascending colon: Secondary | ICD-10-CM | POA: Diagnosis not present

## 2018-05-03 DIAGNOSIS — Z1211 Encounter for screening for malignant neoplasm of colon: Secondary | ICD-10-CM

## 2018-05-03 MED ORDER — SODIUM CHLORIDE 0.9 % IV SOLN: 500.0000 mL | Freq: Once | INTRAVENOUS | Status: DC

## 2018-05-03 NOTE — Progress Notes (Signed)
Report to PACU, RN, vss, BBS= Clear.  

## 2018-05-03 NOTE — Progress Notes (Signed)
Pt's states no medical or surgical changes since previsit or office visit. 

## 2018-05-03 NOTE — Patient Instructions (Addendum)
I found and removed one tiny polyp that looks benign. It might be pre-cancerous - that is common and should not be a cause for concern, especially since it has been removed.  You also have a condition called diverticulosis - common and not usually a problem. Please read the handout provided.  I do not think you will need to return for a routine repeat colonoscopy - I will let you know results and recommendations by mail or My Chart.  I appreciate the opportunity to care for you. Gatha Mayer, MD, FACG  YOU HAD AN ENDOSCOPIC PROCEDURE TODAY AT [facility]:   Refer to the procedure report that was given to you for any specific questions about what was found during the examination.  If the procedure report does not answer your questions, please call your gastroenterologist to clarify.  If you requested that your care partner not be given the details of your procedure findings, then the procedure report has been included in a sealed envelope for you to review at your convenience later.  YOU SHOULD EXPECT: Some feelings of bloating in the abdomen. Passage of more gas than usual.  Walking can help get rid of the air that was put into your GI tract during the procedure and reduce the bloating. If you had a lower endoscopy (such as a colonoscopy or flexible sigmoidoscopy) you may notice spotting of blood in your stool or on the toilet paper. If you underwent a bowel prep for your procedure, you may not have a normal bowel movement for a few days.  Please Note:  You might notice some irritation and congestion in your nose or some drainage.  This is from the oxygen used during your procedure.  There is no need for concern and it should clear up in a day or so.  SYMPTOMS TO REPORT IMMEDIATELY:   Following lower endoscopy (colonoscopy or flexible sigmoidoscopy):  Excessive amounts of blood in the stool  Significant tenderness or worsening of abdominal pains  Swelling of the  abdomen that is new, acute  Fever of 100F or higher For urgent or emergent issues, a gastroenterologist can be reached at any hour by calling [phone].   DIET:  We do recommend a small meal at first, but then you may proceed to your regular diet.  Drink plenty of fluids but you should avoid alcoholic beverages for 24 hours.  ACTIVITY:  You should plan to take it easy for the rest of today and you should NOT DRIVE or use heavy machinery until tomorrow (because of the sedation medicines used during the test).    FOLLOW UP: Our staff will call the number listed on your records the next business day following your procedure to check on you and address any questions or concerns that you may have regarding the information given to you following your procedure. If we do not reach you, we will leave a message.  However, if you are feeling well and you are not experiencing any problems, there is no need to return our call.  We will assume that you have returned to your regular daily activities without incident.  If any biopsies were taken you will be contacted by phone or by letter within the next 1-3 weeks.  Please call us at [phone] if you have not heard about the biopsies in 3 weeks.    SIGNATURES/CONFIDENTIALITY: You and/or your care partner have signed paperwork which will be entered into your electronic medical record.  These signatures attest to the fact that that the information above on your After Visit Summary has been reviewed and is understood.  Full responsibility of the confidentiality of this discharge information lies with you and/or your care-partner.

## 2018-05-03 NOTE — Progress Notes (Signed)
Called to room to assist during endoscopic procedure.  Patient ID and intended procedure confirmed with present staff. Received instructions for my participation in the procedure from the performing physician.  

## 2018-05-03 NOTE — Op Note (Signed)
[facility] Patient Name: Alicia Cordova Procedure Date: 05/03/2018 7:45 AM MRN: [id number] Endoscopist: Gatha Mayer , MD Age: 72 Referring MD:  Date of Birth: 1946/02/18 Gender: Female Account #: [id number] Procedure:                Colonoscopy Indications:              Screening for colorectal malignant neoplasm, Last                            colonoscopy: 2009 Medicines:                Propofol per Anesthesia, Monitored Anesthesia Care Procedure:                Pre-Anesthesia Assessment:                           - Prior to the procedure, a History and Physical                            was performed, and patient medications and                            allergies were reviewed. The patient's tolerance of                            previous anesthesia was also reviewed. The risks                            and benefits of the procedure and the sedation                            options and risks were discussed with the patient.                            All questions were answered, and informed consent                            was obtained. Prior Anticoagulants: The patient has                            taken no previous anticoagulant or antiplatelet                            agents. ASA Grade Assessment: II - A patient with                            mild systemic disease. After reviewing the risks                            and benefits, the patient was deemed in                            satisfactory condition to undergo the procedure.  After obtaining informed consent, the colonoscope                            was passed under direct vision. Throughout the                            procedure, the patient's blood pressure, pulse, and                            oxygen saturations were monitored continuously. The                            Colonoscope was introduced through the anus and                            advanced to the  the cecum, identified by                            appendiceal orifice and ileocecal valve. The                            quality of the bowel preparation was excellent. The                            colonoscopy was performed without difficulty. The                            patient tolerated the procedure well. The bowel                            preparation used was Miralax. Scope In: 7:53:11 AM Scope Out: 8:13:41 AM Scope Withdrawal Time: 0 hours 16 minutes 9 seconds  Total Procedure Duration: 0 hours 20 minutes 30 seconds  Findings:                 The perianal and digital rectal examinations were                            normal.                           A diminutive polyp was found in the ascending                            colon. The polyp was sessile. The polyp was removed                            with a cold snare. Resection and retrieval were                            complete. Verification of patient identification                            for the specimen was done. Estimated blood loss was  minimal.                           Multiple diverticula were found in the sigmoid                            colon, descending colon and ascending colon.                           The exam was otherwise without abnormality on                            direct and retroflexion views. Complications:            No immediate complications. Estimated blood loss:                            None. Estimated Blood Loss:     Estimated blood loss: none. Impression:               - One diminutive polyp in the ascending colon,                            removed with a cold snare. Resected and retrieved.                           - Diverticulosis in the sigmoid colon, in the                            descending colon and in the ascending colon.                           - The examination was otherwise normal on direct                            and retroflexion  views. Recommendation:           - Patient has a contact number available for                            emergencies. The signs and symptoms of potential                            delayed complications were discussed with the                            patient. Return to normal activities tomorrow.                            Written discharge instructions were provided to the                            patient.                           - Resume previous diet.                           -  Continue present medications.                           - Likely no repeat colonoscopy due to age and only                            1 diminutive polyp - await pathology review for                            final recommendation. Gatha Mayer, MD 05/03/2018 8:23:28 AM This report has been signed electronically.

## 2018-05-06 ENCOUNTER — Telehealth: Payer: Self-pay | Admitting: *Deleted

## 2018-05-06 NOTE — Telephone Encounter (Signed)
  Follow up Call-  Call back number 05/03/2018  Post procedure Call Back phone  # [phone]  Permission to leave phone message Yes  Some recent data might be hidden     Patient questions:  Do you have a fever, pain , or abdominal swelling? No. Pain Score  0 *  Have you tolerated food without any problems? Yes.    Have you been able to return to your normal activities? Yes.    Do you have any questions about your discharge instructions: Diet   No. Medications  No. Follow up visit  No.  Do you have questions or concerns about your Care? No.  Actions: * If pain score is 4 or above: No action needed, pain <4.

## 2018-05-12 ENCOUNTER — Encounter: Payer: Self-pay | Admitting: Internal Medicine

## 2018-05-12 NOTE — Progress Notes (Signed)
Diminutive adenoma No recall - she is 58 My Chart

## 2018-05-22 DIAGNOSIS — Z8262 Family history of osteoporosis: Secondary | ICD-10-CM | POA: Diagnosis not present

## 2018-05-22 DIAGNOSIS — Z1231 Encounter for screening mammogram for malignant neoplasm of breast: Secondary | ICD-10-CM | POA: Diagnosis not present

## 2018-05-22 DIAGNOSIS — M810 Age-related osteoporosis without current pathological fracture: Secondary | ICD-10-CM | POA: Diagnosis not present

## 2018-05-24 ENCOUNTER — Encounter: Payer: Self-pay | Admitting: Anesthesiology

## 2018-06-11 DIAGNOSIS — R509 Fever, unspecified: Secondary | ICD-10-CM | POA: Diagnosis not present

## 2018-06-19 HISTORY — PX: CATARACT EXTRACTION, BILATERAL: SHX1313

## 2018-07-03 DIAGNOSIS — I10 Essential (primary) hypertension: Secondary | ICD-10-CM | POA: Diagnosis not present

## 2018-07-03 DIAGNOSIS — E039 Hypothyroidism, unspecified: Secondary | ICD-10-CM | POA: Diagnosis not present

## 2018-07-03 DIAGNOSIS — H903 Sensorineural hearing loss, bilateral: Secondary | ICD-10-CM | POA: Diagnosis not present

## 2018-07-03 DIAGNOSIS — Z0000 Encounter for general adult medical examination without abnormal findings: Secondary | ICD-10-CM | POA: Diagnosis not present

## 2018-07-03 DIAGNOSIS — Z13220 Encounter for screening for lipoid disorders: Secondary | ICD-10-CM | POA: Diagnosis not present

## 2018-07-03 DIAGNOSIS — Z1159 Encounter for screening for other viral diseases: Secondary | ICD-10-CM | POA: Diagnosis not present

## 2018-07-03 DIAGNOSIS — H8103 Meniere's disease, bilateral: Secondary | ICD-10-CM | POA: Diagnosis not present

## 2018-07-22 DIAGNOSIS — H4089 Other specified glaucoma: Secondary | ICD-10-CM | POA: Diagnosis not present

## 2018-07-22 DIAGNOSIS — E038 Other specified hypothyroidism: Secondary | ICD-10-CM | POA: Diagnosis not present

## 2018-07-22 DIAGNOSIS — M810 Age-related osteoporosis without current pathological fracture: Secondary | ICD-10-CM | POA: Diagnosis not present

## 2018-07-22 DIAGNOSIS — D126 Benign neoplasm of colon, unspecified: Secondary | ICD-10-CM | POA: Diagnosis not present

## 2018-07-25 ENCOUNTER — Telehealth: Payer: Self-pay | Admitting: *Deleted

## 2018-07-25 NOTE — Telephone Encounter (Signed)
Prolia insurance verification has been sent awaiting Summary of benefits  

## 2018-07-26 NOTE — Telephone Encounter (Addendum)
Deductible $3000 [phone])  OOP MAX [phone] (867 met)  Annual exam 10/10/17 ML  Calcium   9.8          Date 01/30/18  Upcoming dental procedures  NO  Prior Authorization needed  Yes but on file vaild through 01/24/2019  Pt estimated Cost $2357 -1500 PROLIA CARD= 857 left pt will use prolia card    APPT 08/08/18 @ 9:00       Coverage Details: 20% one dose ,20% admin fee

## 2018-08-08 ENCOUNTER — Ambulatory Visit (INDEPENDENT_AMBULATORY_CARE_PROVIDER_SITE_OTHER): Payer: BLUE CROSS/BLUE SHIELD | Admitting: Anesthesiology

## 2018-08-08 DIAGNOSIS — M810 Age-related osteoporosis without current pathological fracture: Secondary | ICD-10-CM | POA: Diagnosis not present

## 2018-08-08 MED ORDER — DENOSUMAB 60 MG/ML ~~LOC~~ SOSY
60.0000 mg | PREFILLED_SYRINGE | Freq: Once | SUBCUTANEOUS | Status: AC
  Administered 2018-08-08: 60 mg via SUBCUTANEOUS

## 2018-08-08 NOTE — Telephone Encounter (Signed)
PROLIA GIVEN 08/08/2018 NEXT INJECTION 02/07/2019

## 2018-10-14 ENCOUNTER — Encounter: Payer: BLUE CROSS/BLUE SHIELD | Admitting: Obstetrics & Gynecology

## 2018-10-16 DIAGNOSIS — H401131 Primary open-angle glaucoma, bilateral, mild stage: Secondary | ICD-10-CM | POA: Diagnosis not present

## 2018-11-06 DIAGNOSIS — Z03818 Encounter for observation for suspected exposure to other biological agents ruled out: Secondary | ICD-10-CM | POA: Diagnosis not present

## 2018-11-12 ENCOUNTER — Encounter: Payer: BLUE CROSS/BLUE SHIELD | Admitting: Obstetrics & Gynecology

## 2018-11-20 DIAGNOSIS — N390 Urinary tract infection, site not specified: Secondary | ICD-10-CM | POA: Diagnosis not present

## 2018-11-21 DIAGNOSIS — M2560 Stiffness of unspecified joint, not elsewhere classified: Secondary | ICD-10-CM | POA: Diagnosis not present

## 2018-11-21 DIAGNOSIS — M6281 Muscle weakness (generalized): Secondary | ICD-10-CM | POA: Diagnosis not present

## 2018-11-21 DIAGNOSIS — M545 Low back pain: Secondary | ICD-10-CM | POA: Diagnosis not present

## 2018-11-23 ENCOUNTER — Telehealth: Payer: BLUE CROSS/BLUE SHIELD | Admitting: Nurse Practitioner

## 2018-11-23 ENCOUNTER — Emergency Department (HOSPITAL_COMMUNITY)
Admission: EM | Admit: 2018-11-23 | Discharge: 2018-11-23 | Disposition: A | Discharge status: Home / Self Care | Payer: BC Managed Care – PPO | Attending: Emergency Medicine | Admitting: Emergency Medicine

## 2018-11-23 ENCOUNTER — Other Ambulatory Visit: Payer: Self-pay

## 2018-11-23 ENCOUNTER — Encounter (HOSPITAL_COMMUNITY): Payer: Self-pay

## 2018-11-23 DIAGNOSIS — E039 Hypothyroidism, unspecified: Secondary | ICD-10-CM | POA: Insufficient documentation

## 2018-11-23 DIAGNOSIS — Z79899 Other long term (current) drug therapy: Secondary | ICD-10-CM | POA: Diagnosis not present

## 2018-11-23 DIAGNOSIS — I10 Essential (primary) hypertension: Secondary | ICD-10-CM | POA: Insufficient documentation

## 2018-11-23 DIAGNOSIS — N390 Urinary tract infection, site not specified: Secondary | ICD-10-CM | POA: Diagnosis not present

## 2018-11-23 DIAGNOSIS — Z20822 Contact with and (suspected) exposure to covid-19: Secondary | ICD-10-CM

## 2018-11-23 DIAGNOSIS — R509 Fever, unspecified: Secondary | ICD-10-CM | POA: Diagnosis present

## 2018-11-23 LAB — URINALYSIS, ROUTINE W REFLEX MICROSCOPIC
Bilirubin Urine: NEGATIVE
Glucose, UA: NEGATIVE mg/dL
Ketones, ur: NEGATIVE mg/dL
Nitrite: NEGATIVE
Protein, ur: 100 mg/dL — AB
Specific Gravity, Urine: 1.014 (ref 1.005–1.030)
WBC, UA: >50 WBC/hpf — ABNORMAL HIGH (ref 0–5)
pH: 6 (ref 5.0–8.0)

## 2018-11-23 MED ORDER — CEPHALEXIN 500 MG PO CAPS: 500.0000 mg | ORAL_CAPSULE | Freq: Three times a day (TID) | ORAL | 0 refills | Status: DC

## 2018-11-23 MED ORDER — ACETAMINOPHEN 325 MG PO TABS
650.0000 mg | ORAL_TABLET | Freq: Once | ORAL | Status: AC
  Administered 2018-11-23: 650 mg via ORAL
  Filled 2018-11-23: qty 2

## 2018-11-23 MED ORDER — CEFTRIAXONE SODIUM 1 G IJ SOLR
1.0000 g | Freq: Once | INTRAMUSCULAR | Status: AC
  Administered 2018-11-23: 1 g via INTRAMUSCULAR
  Filled 2018-11-23: qty 10

## 2018-11-23 MED ORDER — STERILE WATER FOR INJECTION IJ SOLN
INTRAMUSCULAR | Status: AC
  Administered 2018-11-23: 10 mL
  Filled 2018-11-23: qty 10

## 2018-11-23 NOTE — ED Triage Notes (Signed)
Pt states she was seen Wednesday at [facility] in and dx with UTI. Pt was given abx (sulfamethazole) and has been taking as prescribed. Pt states she is still having frequency.  Pt also talks about muscle aches after exercising and mowing the lawn. Aleve at 0600. No Tylenol

## 2018-11-23 NOTE — ED Provider Notes (Signed)
[facility] DEPT Provider Note   CSN: [id number] Arrival date & time: 11/23/18  1122    History   Chief Complaint Chief Complaint  Patient presents with  . Fever  . Urinary Tract Infection    HPI Alicia Cordova is a 73 y.o. female.     73 year old female presents with diffuse myalgias x24 hours.  Patient recently diagnosed with UTI and has been on Bactrim.  Denies any vomiting or flank pain.  No cough or congestion.  No cold like symptoms.  Has used Aleve for her subjective fever at home.  Denies any rashes.     Past Medical History:  Diagnosis Date  . Cataract   . CHEST PAIN-UNSPECIFIED 05/26/2009   Qualifier: Diagnosis of  By: Mare Ferrari, RMA, Sherri    . Glaucoma   . Hormone disorder   . Hx of adenomatous polyp of colon 04/2018   no recall due to age  . Hypertension   . HYPOTHYROIDISM 05/26/2009   Qualifier: Diagnosis of  By: Ron Parker, MD, Leonidas Romberg Dorinda Hill   . Osteoporosis     Patient Active Problem List   Diagnosis Date Noted  . HYPOTHYROIDISM 05/26/2009  . CHEST PAIN-UNSPECIFIED 05/26/2009    Past Surgical History:  Procedure Laterality Date  . arm surgery     metal plate in left arm  . COLONOSCOPY  2009   repeated 2019 - diminutive adenoma - no recall (age)  . TUBAL LIGATION    . TYMPANOSTOMY TUBE PLACEMENT       OB History    Gravida  2   Para  2   Term      Preterm      AB      Living  2     SAB      TAB      Ectopic      Multiple      Live Births               Home Medications    Prior to Admission medications   Medication Sig Start Date End Date Taking? Authorizing Provider  BIOTIN 5000 PO Take 5,000 mcg by mouth daily.    [provider]  calcium carbonate (CALCIUM 600) 600 MG TABS tablet Take 600 mg by mouth 2 (two) times daily with a meal.    [provider]  cholecalciferol (VITAMIN D) 1000 units tablet Take 1,000 Units by mouth daily.    [provider]   Cranberry 1000 MG CAPS Take by mouth.    [provider]  denosumab (PROLIA) 60 MG/ML SOSY injection Inject 60 mg into the skin every 6 (six) months. 10/10/17   Princess Bruins, MD  glucosamine-chondroitin 500-400 MG tablet Take 1 tablet by mouth daily.     [provider]  levothyroxine (SYNTHROID, LEVOTHROID) 75 MCG tablet Take 75 mcg by mouth daily before breakfast.    [provider]  Multiple Vitamin (MULTIVITAMIN) tablet Take 1 tablet by mouth daily.    [provider]  timolol (TIMOPTIC) 0.5 % ophthalmic solution Place 1 drop into both eyes 2 (two) times daily.  03/27/18   [provider]  triamterene-hydrochlorothiazide (DYAZIDE) 37.5-25 MG capsule Take 1 capsule by mouth every other day.    [provider]  TURMERIC PO Take by mouth.    [provider]    Family History Family History  Problem Relation Age of Onset  . Congestive Heart Failure Mother   . Stroke Mother   .  Melanoma Mother   . Colon cancer Maternal Aunt   . Esophageal cancer Neg Hx   . Rectal cancer Neg Hx   . Stomach cancer Neg Hx     Social History Social History   Tobacco Use  . Smoking status: Never Smoker  . Smokeless tobacco: Never Used  Substance Use Topics  . Alcohol use: Yes    Comment: glass every other day   . Drug use: Never     Allergies   Lisinopril   Review of Systems Review of Systems  All other systems reviewed and are negative.    Physical Exam Updated Vital Signs BP (!) 138/100 (BP Location: Left Arm)   Pulse 91   Temp (!) 101.8 F (38.8 C) (Oral)   Resp 16   Ht 1.575 m (5\' 2" )   Wt 58.1 kg   SpO2 100%   BMI 23.41 kg/m   Physical Exam Vitals signs and nursing note reviewed.  Constitutional:      General: She is not in acute distress.    Appearance: Normal appearance. She is well-developed. She is not toxic-appearing.  HENT:     Head: Normocephalic and atraumatic.  Eyes:     General: Lids are  normal.     Conjunctiva/sclera: Conjunctivae normal.     Pupils: Pupils are equal, round, and reactive to light.  Neck:     Musculoskeletal: Normal range of motion and neck supple.     Thyroid: No thyroid mass.     Trachea: No tracheal deviation.  Cardiovascular:     Rate and Rhythm: Normal rate and regular rhythm.     Heart sounds: Normal heart sounds. No murmur. No gallop.   Pulmonary:     Effort: Pulmonary effort is normal. No respiratory distress.     Breath sounds: Normal breath sounds. No stridor. No decreased breath sounds, wheezing, rhonchi or rales.  Abdominal:     General: Bowel sounds are normal. There is no distension.     Palpations: Abdomen is soft.     Tenderness: There is no abdominal tenderness. There is no rebound.  Musculoskeletal: Normal range of motion.        General: No tenderness.  Skin:    General: Skin is warm and dry.     Findings: No abrasion or rash.  Neurological:     Mental Status: She is alert and oriented to person, place, and time.     GCS: GCS eye subscore is 4. GCS verbal subscore is 5. GCS motor subscore is 6.     Cranial Nerves: No cranial nerve deficit.     Sensory: No sensory deficit.  Psychiatric:        Speech: Speech normal.        Behavior: Behavior normal.      ED Treatments / Results  Labs (all labs ordered are listed, but only abnormal results are displayed) Labs Reviewed  URINALYSIS, ROUTINE W REFLEX MICROSCOPIC - Abnormal; Notable for the following components:      Result Value   APPearance HAZY (*)    Hgb urine dipstick SMALL (*)    Protein, ur 100 (*)    Leukocytes,Ua MODERATE (*)    WBC, UA >50 (*)    Bacteria, UA FEW (*)    All other components within normal limits  URINE CULTURE    EKG None  Radiology No results found.  Procedures Procedures (including critical care time)  Medications Ordered in ED Medications  acetaminophen (TYLENOL) tablet 650 mg (has  no administration in time range)  cefTRIAXone  (ROCEPHIN) injection 1 g (has no administration in time range)     Initial Impression / Assessment and Plan / ED Course  I have reviewed the triage vital signs and the nursing notes.  Pertinent labs & imaging results that were available during my care of the patient were reviewed by me and considered in my medical decision making (see chart for details).       Patient's urinalysis here consistent with infection.  Was treated with Tylenol orally as well as given Rocephin 1 g IM.  Patient nontoxic-appearing at this time.  We will have her stop Bactrim and placed on Keflex.  Urine culture sent.  Return precautions given.  XIOMARA SEVILLANO was evaluated in Emergency Department on 11/23/2018 for the symptoms described in the history of present illness. She was evaluated in the context of the global COVID-19 pandemic, which necessitated consideration that the patient might be at risk for infection with the SARS-CoV-2 virus that causes COVID-19. Institutional protocols and algorithms that pertain to the evaluation of patients at risk for COVID-19 are in a state of rapid change based on information released by regulatory bodies including the CDC and federal and state organizations. These policies and algorithms were followed during the patient's care in the ED.   Final Clinical Impressions(s) / ED Diagnoses   Final diagnoses:  None    ED Discharge Orders    None       Lacretia Leigh, MD 11/23/18 1409

## 2018-11-23 NOTE — Discharge Instructions (Addendum)
Stop taking the Bactrim at this time.  Return here for any problems

## 2018-11-23 NOTE — Progress Notes (Signed)
Based on what you shared with me it looks like you could have have covid,that should be evaluated in a face to face office visit. The only way you can be tested is to go to the ER at [facility]. Since you live in [place] that is the best thing for you to do. They will make decision about whether    NOTE: If you entered your credit card information for this eVisit, you will not be charged. You may see a "hold" on your card for the $30 but that hold will drop off and you will not have a charge processed.  If you are having a true medical emergency please call 911.  If you need an urgent face to face visit, [place] [facility] has four urgent care centers for your convenience.

## 2018-11-25 LAB — URINE CULTURE: Culture: >=100000 — AB

## 2018-11-26 ENCOUNTER — Telehealth: Payer: Self-pay | Admitting: Emergency Medicine

## 2018-11-26 NOTE — Telephone Encounter (Signed)
Post ED Visit - Positive Culture Follow-up  Culture report reviewed by antimicrobial stewardship pharmacist: [facility] Team []  Elenor Quinones, Pharm.D. []  Heide Guile, Pharm.D., BCPS AQ-ID []  Parks Neptune, Pharm.D., BCPS []  Alycia Rossetti, Pharm.D., BCPS []  [place], [place].D., BCPS, AAHIVP []  Legrand Como, Pharm.D., BCPS, AAHIVP []  Salome Arnt, PharmD, BCPS []  Johnnette Gourd, PharmD, BCPS []  Hughes Better, PharmD, BCPS []  Leeroy Cha, PharmD []  Laqueta Linden, PharmD, BCPS []  Albertina Parr, PharmD  [facility] Team []  Leodis Sias, PharmD []  Lindell Spar, PharmD []  Royetta Asal, PharmD []  Graylin Shiver, Rph []  Rema Fendt) Glennon Mac, PharmD []  Arlyn Dunning, PharmD []  Netta Cedars, PharmD [x]  Dia Sitter, PharmD []  Leone Haven, PharmD []  Gretta Arab, PharmD []  Theodis Shove, PharmD []  Peggyann Juba, PharmD []  Reuel Boom, PharmD   Positive urine culture Treated with cephalexin, organism sensitive to the same and no further patient follow-up is required at this time.  Hazle Nordmann 11/26/2018, 11:34 AM

## 2018-12-05 DIAGNOSIS — M2560 Stiffness of unspecified joint, not elsewhere classified: Secondary | ICD-10-CM | POA: Diagnosis not present

## 2018-12-05 DIAGNOSIS — M6281 Muscle weakness (generalized): Secondary | ICD-10-CM | POA: Diagnosis not present

## 2018-12-05 DIAGNOSIS — M545 Low back pain: Secondary | ICD-10-CM | POA: Diagnosis not present

## 2018-12-10 ENCOUNTER — Other Ambulatory Visit: Payer: Self-pay

## 2018-12-11 ENCOUNTER — Ambulatory Visit (INDEPENDENT_AMBULATORY_CARE_PROVIDER_SITE_OTHER): Payer: BC Managed Care – PPO | Admitting: Obstetrics & Gynecology

## 2018-12-11 ENCOUNTER — Encounter: Payer: Self-pay | Admitting: Obstetrics & Gynecology

## 2018-12-11 ENCOUNTER — Other Ambulatory Visit: Payer: Self-pay

## 2018-12-11 VITALS — BP 134/90 | Ht 61.5 in | Wt 129.0 lb

## 2018-12-11 DIAGNOSIS — N3000 Acute cystitis without hematuria: Secondary | ICD-10-CM

## 2018-12-11 DIAGNOSIS — M810 Age-related osteoporosis without current pathological fracture: Secondary | ICD-10-CM

## 2018-12-11 DIAGNOSIS — Z01419 Encounter for gynecological examination (general) (routine) without abnormal findings: Secondary | ICD-10-CM | POA: Diagnosis not present

## 2018-12-11 DIAGNOSIS — N898 Other specified noninflammatory disorders of vagina: Secondary | ICD-10-CM

## 2018-12-11 DIAGNOSIS — Z780 Asymptomatic menopausal state: Secondary | ICD-10-CM | POA: Diagnosis not present

## 2018-12-11 LAB — WET PREP FOR TRICH, YEAST, CLUE

## 2018-12-11 NOTE — Progress Notes (Signed)
YISELLE BABICH 06-Mar-1946 [id number]   History:    73 y.o. G2P2L2 Widowed.  Has a cruise organize for the fall.  RP:  Established patient presenting for annual gyn exam   HPI: Menopause, well on no hormone replacement therapy.  No postmenopausal bleeding.  No pelvic pain.  Abstinent.  Urine and bowel movements normal.  Breast normal.  Body mass index 23.98.  Physically active.  Health labs with family physician.  Osteoporosis on Prolia.  Taking vitamin D supplements and calcium intake of 1200 mg daily.  Past medical history,surgical history, family history and social history were all reviewed and documented in the EPIC chart.  Gynecologic History No LMP recorded. Patient is postmenopausal. Contraception: post menopausal status Last Pap: 09/2016. Results were: Negative Last mammogram: 05/2018. Results were: Negative Bone Density: 05/2018 Osteoporosis stable to improved on Prolia Colonoscopy: 2019  Obstetric History OB History  Gravida Para Term Preterm AB Living  2 2       2   SAB TAB Ectopic Multiple Live Births               # Outcome Date GA Lbr Len/2nd Weight Sex Delivery Anes PTL Lv  2 Para           1 Para              ROS: A ROS was performed and pertinent positives and negatives are included in the history.  GENERAL: No fevers or chills. HEENT: No change in vision, no earache, sore throat or sinus congestion. NECK: No pain or stiffness. CARDIOVASCULAR: No chest pain or pressure. No palpitations. PULMONARY: No shortness of breath, cough or wheeze. GASTROINTESTINAL: No abdominal pain, nausea, vomiting or diarrhea, melena or bright red blood per rectum. GENITOURINARY: No urinary frequency, urgency, hesitancy or dysuria. MUSCULOSKELETAL: No joint or muscle pain, no back pain, no recent trauma. DERMATOLOGIC: No rash, no itching, no lesions. ENDOCRINE: No polyuria, polydipsia, no heat or cold intolerance. No recent change in weight. HEMATOLOGICAL: No anemia or easy bruising or  bleeding. NEUROLOGIC: No headache, seizures, numbness, tingling or weakness. PSYCHIATRIC: No depression, no loss of interest in normal activity or change in sleep pattern.     Exam:   BP 134/90   Ht 5' 1.5" (1.562 m)   Wt 129 lb (58.5 kg)   BMI 23.98 kg/m   Body mass index is 23.98 kg/m.  General appearance : Well developed well nourished female. No acute distress HEENT: Eyes: no retinal hemorrhage or exudates,  Neck supple, trachea midline, no carotid bruits, no thyroidmegaly Lungs: Clear to auscultation, no rhonchi or wheezes, or rib retractions  Heart: Regular rate and rhythm, no murmurs or gallops Breast:Examined in sitting and supine position were symmetrical in appearance, no palpable masses or tenderness,  no skin retraction, no nipple inversion, no nipple discharge, no skin discoloration, no axillary or supraclavicular lymphadenopathy Abdomen: no palpable masses or tenderness, no rebound or guarding Extremities: no edema or skin discoloration or tenderness  Pelvic: Vulva: Normal             Vagina: No gross lesions or discharge.  Wet prep done  Cervix: No gross lesions or discharge  Uterus  AV, normal size, shape and consistency, non-tender and mobile  Adnexa  Without masses or tenderness  Anus: Normal  Wet prep negative  U/A: Yellow clear, proteins negative, nitrites negative, white blood cells 0-5, red blood cells 3-10, no bacteria.  Urine culture pending.   Assessment/Plan:  73 y.o. female for  annual exam   1. Well female exam with routine gynecological exam Normal gynecologic exam and menopause.  Pap test in April 2018 was negative, no indication to repeat this year.  Breast exam normal.  Screening mammogram December 2019 was negative.  Colonoscopy in 2019.  Health labs with family physician.  2. Postmenopause Well on no hormone replacement therapy.  No postmenopausal bleeding.  3. Age-related osteoporosis without current pathological fracture Osteoporosis with  last bone density December 2019 showing stability to a mild improvement on Prolia.  Next Prolia injection 01/2019 here.  Continue with vitamin D supplements, calcium intake of 1200 mg daily and regular weightbearing physical activities.  4. Acute recurrent cystitis Recent episode of cystitis.  Currently asymptomatic.  We will do a test of cure today.  Urine analysis just mildly perturbed.  Will wait on culture. - Urinalysis,Complete w/RFL Culture  5. Vaginal irritation Wet prep negative, patient reassured. - WET PREP FOR TRICH, YEAST, CLUE  Princess Bruins MD, 8:37 AM 12/11/2018

## 2018-12-12 ENCOUNTER — Encounter: Payer: Self-pay | Admitting: Obstetrics & Gynecology

## 2018-12-12 NOTE — Patient Instructions (Signed)
1. Well female exam with routine gynecological exam Normal gynecologic exam and menopause.  Pap test in April 2018 was negative, no indication to repeat this year.  Breast exam normal.  Screening mammogram December 2019 was negative.  Colonoscopy in 2019.  Health labs with family physician.  2. Postmenopause Well on no hormone replacement therapy.  No postmenopausal bleeding.  3. Age-related osteoporosis without current pathological fracture Osteoporosis with last bone density December 2019 showing stability to a mild improvement on Prolia.  Next Prolia injection 01/2019 here.  Continue with vitamin D supplements, calcium intake of 1200 mg daily and regular weightbearing physical activities.  4. Acute recurrent cystitis Recent episode of cystitis.  Currently asymptomatic.  We will do a test of cure today.  Urine analysis just mildly perturbed.  Will wait on culture. - Urinalysis,Complete w/RFL Culture  5. Vaginal irritation Wet prep negative, patient reassured. - WET PREP FOR [place], YEAST, CLUE  Alicia Cordova, it was a pleasure seeing you today!  I will inform you of your results as soon as they are available.

## 2018-12-13 LAB — URINE CULTURE
MICRO NUMBER:: 606883
Result:: NO GROWTH
SPECIMEN QUALITY:: ADEQUATE

## 2018-12-13 LAB — URINALYSIS, COMPLETE W/RFL CULTURE
Bacteria, UA: NONE SEEN /HPF
Bilirubin Urine: NEGATIVE
Glucose, UA: NEGATIVE
Hyaline Cast: NONE SEEN /LPF
Ketones, ur: NEGATIVE
Leukocyte Esterase: NEGATIVE
Nitrites, Initial: NEGATIVE
Protein, ur: NEGATIVE
Specific Gravity, Urine: 1.015 (ref 1.001–1.03)
pH: 5.5 (ref 5.0–8.0)

## 2018-12-13 LAB — CULTURE INDICATED

## 2018-12-16 DIAGNOSIS — M545 Low back pain: Secondary | ICD-10-CM | POA: Diagnosis not present

## 2018-12-16 DIAGNOSIS — M2560 Stiffness of unspecified joint, not elsewhere classified: Secondary | ICD-10-CM | POA: Diagnosis not present

## 2018-12-16 DIAGNOSIS — M6281 Muscle weakness (generalized): Secondary | ICD-10-CM | POA: Diagnosis not present

## 2019-01-10 ENCOUNTER — Telehealth: Payer: Self-pay | Admitting: *Deleted

## 2019-01-10 DIAGNOSIS — M810 Age-related osteoporosis without current pathological fracture: Secondary | ICD-10-CM

## 2019-01-10 NOTE — Telephone Encounter (Signed)
Prolia insurance verification has been sent awaiting Summary of benefits  

## 2019-01-22 NOTE — Telephone Encounter (Addendum)
Deductible $3000 ($3016met)  OOP MAX [id number] ($[id number])  Annual exam 12/11/2018 ML  Calcium     9.7         Date  LABS 01/28/2019 @11 :00  Upcoming dental procedures   Prior Authorization needed   Pt estimated Cost $224    02/10/2019 @ 8:30    Coverage Details:20^one dose,20% admin fee

## 2019-01-24 ENCOUNTER — Other Ambulatory Visit: Payer: Self-pay

## 2019-01-28 ENCOUNTER — Other Ambulatory Visit: Payer: BC Managed Care – PPO

## 2019-01-28 ENCOUNTER — Other Ambulatory Visit: Payer: Self-pay

## 2019-01-28 DIAGNOSIS — M810 Age-related osteoporosis without current pathological fracture: Secondary | ICD-10-CM

## 2019-01-28 LAB — CALCIUM: Calcium: 9.7 mg/dL (ref 8.6–10.4)

## 2019-02-10 ENCOUNTER — Other Ambulatory Visit: Payer: Self-pay

## 2019-02-10 ENCOUNTER — Ambulatory Visit (INDEPENDENT_AMBULATORY_CARE_PROVIDER_SITE_OTHER): Payer: BC Managed Care – PPO | Admitting: Anesthesiology

## 2019-02-10 DIAGNOSIS — M810 Age-related osteoporosis without current pathological fracture: Secondary | ICD-10-CM | POA: Diagnosis not present

## 2019-02-10 MED ORDER — DENOSUMAB 60 MG/ML ~~LOC~~ SOSY
60.0000 mg | PREFILLED_SYRINGE | Freq: Once | SUBCUTANEOUS | Status: AC
  Administered 2019-02-10: 60 mg via SUBCUTANEOUS

## 2019-02-14 DIAGNOSIS — H25013 Cortical age-related cataract, bilateral: Secondary | ICD-10-CM | POA: Diagnosis not present

## 2019-02-14 DIAGNOSIS — H5213 Myopia, bilateral: Secondary | ICD-10-CM | POA: Diagnosis not present

## 2019-02-14 DIAGNOSIS — H401131 Primary open-angle glaucoma, bilateral, mild stage: Secondary | ICD-10-CM | POA: Diagnosis not present

## 2019-03-13 DIAGNOSIS — H25811 Combined forms of age-related cataract, right eye: Secondary | ICD-10-CM | POA: Diagnosis not present

## 2019-03-13 DIAGNOSIS — H2511 Age-related nuclear cataract, right eye: Secondary | ICD-10-CM | POA: Diagnosis not present

## 2019-04-04 NOTE — Telephone Encounter (Signed)
[facility] GIVEN 02/10/2019 NEXT INJECTION 08/13/2018

## 2019-04-15 DIAGNOSIS — R350 Frequency of micturition: Secondary | ICD-10-CM | POA: Diagnosis not present

## 2019-04-15 DIAGNOSIS — N390 Urinary tract infection, site not specified: Secondary | ICD-10-CM | POA: Diagnosis not present

## 2019-04-23 ENCOUNTER — Encounter: Payer: Self-pay | Admitting: Anesthesiology

## 2019-05-22 DIAGNOSIS — H2512 Age-related nuclear cataract, left eye: Secondary | ICD-10-CM | POA: Diagnosis not present

## 2019-05-22 DIAGNOSIS — H25812 Combined forms of age-related cataract, left eye: Secondary | ICD-10-CM | POA: Diagnosis not present

## 2019-05-28 ENCOUNTER — Encounter: Payer: Self-pay | Admitting: Obstetrics & Gynecology

## 2019-05-28 DIAGNOSIS — Z1231 Encounter for screening mammogram for malignant neoplasm of breast: Secondary | ICD-10-CM | POA: Diagnosis not present

## 2019-06-27 ENCOUNTER — Other Ambulatory Visit: Payer: Self-pay | Admitting: Radiology

## 2019-07-03 ENCOUNTER — Other Ambulatory Visit: Payer: Self-pay | Admitting: Radiology

## 2019-07-03 DIAGNOSIS — R200 Anesthesia of skin: Secondary | ICD-10-CM

## 2019-07-03 DIAGNOSIS — R208 Other disturbances of skin sensation: Secondary | ICD-10-CM

## 2019-07-12 ENCOUNTER — Other Ambulatory Visit: Payer: Self-pay

## 2019-07-12 ENCOUNTER — Ambulatory Visit
Admission: RE | Admit: 2019-07-12 | Discharge: 2019-07-12 | Disposition: A | Discharge status: Home / Self Care | Payer: BC Managed Care – PPO | Source: Ambulatory Visit | Attending: Radiology | Admitting: Radiology

## 2019-07-12 ENCOUNTER — Other Ambulatory Visit: Payer: BC Managed Care – PPO

## 2019-07-12 DIAGNOSIS — M48061 Spinal stenosis, lumbar region without neurogenic claudication: Secondary | ICD-10-CM | POA: Diagnosis not present

## 2019-07-12 DIAGNOSIS — R208 Other disturbances of skin sensation: Secondary | ICD-10-CM

## 2019-07-12 DIAGNOSIS — R200 Anesthesia of skin: Secondary | ICD-10-CM

## 2019-07-16 DIAGNOSIS — M48061 Spinal stenosis, lumbar region without neurogenic claudication: Secondary | ICD-10-CM | POA: Diagnosis not present

## 2019-07-16 DIAGNOSIS — M4316 Spondylolisthesis, lumbar region: Secondary | ICD-10-CM | POA: Diagnosis not present

## 2019-07-16 DIAGNOSIS — M5126 Other intervertebral disc displacement, lumbar region: Secondary | ICD-10-CM | POA: Diagnosis not present

## 2019-07-16 DIAGNOSIS — M4126 Other idiopathic scoliosis, lumbar region: Secondary | ICD-10-CM | POA: Diagnosis not present

## 2019-07-16 DIAGNOSIS — M5416 Radiculopathy, lumbar region: Secondary | ICD-10-CM | POA: Diagnosis not present

## 2019-07-17 DIAGNOSIS — M5416 Radiculopathy, lumbar region: Secondary | ICD-10-CM | POA: Diagnosis not present

## 2019-07-23 DIAGNOSIS — D126 Benign neoplasm of colon, unspecified: Secondary | ICD-10-CM | POA: Diagnosis not present

## 2019-07-23 DIAGNOSIS — H409 Unspecified glaucoma: Secondary | ICD-10-CM | POA: Diagnosis not present

## 2019-07-23 DIAGNOSIS — E039 Hypothyroidism, unspecified: Secondary | ICD-10-CM | POA: Diagnosis not present

## 2019-07-23 DIAGNOSIS — M810 Age-related osteoporosis without current pathological fracture: Secondary | ICD-10-CM | POA: Diagnosis not present

## 2019-07-28 ENCOUNTER — Telehealth: Payer: Self-pay | Admitting: *Deleted

## 2019-07-28 NOTE — Telephone Encounter (Addendum)
Deductible $3000($3046met)  OOP MAX $224  Annual exam 12/11/2018 ML  Calcium   9.7          Date 01/28/2019  Upcoming dental procedures   Prior Authorization needed yes on file approved   Pt estimated Cost $224  Appt 08/14/2019 @ 8:30  Coverage Details: 20% one dose, 20% admin fee

## 2019-08-11 DIAGNOSIS — M5416 Radiculopathy, lumbar region: Secondary | ICD-10-CM | POA: Diagnosis not present

## 2019-08-11 DIAGNOSIS — M4126 Other idiopathic scoliosis, lumbar region: Secondary | ICD-10-CM | POA: Diagnosis not present

## 2019-08-11 DIAGNOSIS — M545 Low back pain: Secondary | ICD-10-CM | POA: Diagnosis not present

## 2019-08-11 DIAGNOSIS — M7130 Other bursal cyst, unspecified site: Secondary | ICD-10-CM | POA: Diagnosis not present

## 2019-08-14 ENCOUNTER — Other Ambulatory Visit: Payer: Self-pay

## 2019-08-14 ENCOUNTER — Ambulatory Visit (INDEPENDENT_AMBULATORY_CARE_PROVIDER_SITE_OTHER): Payer: BC Managed Care – PPO | Admitting: Anesthesiology

## 2019-08-14 DIAGNOSIS — M810 Age-related osteoporosis without current pathological fracture: Secondary | ICD-10-CM | POA: Diagnosis not present

## 2019-08-14 MED ORDER — DENOSUMAB 60 MG/ML ~~LOC~~ SOSY
60.0000 mg | PREFILLED_SYRINGE | Freq: Once | SUBCUTANEOUS | Status: AC
  Administered 2019-08-14: 60 mg via SUBCUTANEOUS

## 2019-09-30 DIAGNOSIS — M545 Low back pain: Secondary | ICD-10-CM | POA: Diagnosis not present

## 2019-09-30 DIAGNOSIS — H8109 Meniere's disease, unspecified ear: Secondary | ICD-10-CM | POA: Diagnosis not present

## 2019-09-30 DIAGNOSIS — Z23 Encounter for immunization: Secondary | ICD-10-CM | POA: Diagnosis not present

## 2019-09-30 DIAGNOSIS — M810 Age-related osteoporosis without current pathological fracture: Secondary | ICD-10-CM | POA: Diagnosis not present

## 2019-09-30 DIAGNOSIS — E039 Hypothyroidism, unspecified: Secondary | ICD-10-CM | POA: Diagnosis not present

## 2019-09-30 DIAGNOSIS — Z0000 Encounter for general adult medical examination without abnormal findings: Secondary | ICD-10-CM | POA: Diagnosis not present

## 2019-09-30 DIAGNOSIS — I10 Essential (primary) hypertension: Secondary | ICD-10-CM | POA: Diagnosis not present

## 2019-10-13 DIAGNOSIS — R319 Hematuria, unspecified: Secondary | ICD-10-CM | POA: Diagnosis not present

## 2019-10-13 DIAGNOSIS — M4316 Spondylolisthesis, lumbar region: Secondary | ICD-10-CM | POA: Diagnosis not present

## 2019-10-13 DIAGNOSIS — M5126 Other intervertebral disc displacement, lumbar region: Secondary | ICD-10-CM | POA: Diagnosis not present

## 2019-10-13 DIAGNOSIS — N390 Urinary tract infection, site not specified: Secondary | ICD-10-CM | POA: Diagnosis not present

## 2019-10-13 DIAGNOSIS — M4126 Other idiopathic scoliosis, lumbar region: Secondary | ICD-10-CM | POA: Diagnosis not present

## 2019-10-13 DIAGNOSIS — M48061 Spinal stenosis, lumbar region without neurogenic claudication: Secondary | ICD-10-CM | POA: Diagnosis not present

## 2019-10-30 DIAGNOSIS — H90A21 Sensorineural hearing loss, unilateral, right ear, with restricted hearing on the contralateral side: Secondary | ICD-10-CM | POA: Diagnosis not present

## 2019-10-30 DIAGNOSIS — H9191 Unspecified hearing loss, right ear: Secondary | ICD-10-CM | POA: Diagnosis not present

## 2019-10-30 DIAGNOSIS — H8101 Meniere's disease, right ear: Secondary | ICD-10-CM | POA: Diagnosis not present

## 2019-10-30 DIAGNOSIS — H8103 Meniere's disease, bilateral: Secondary | ICD-10-CM | POA: Diagnosis not present

## 2019-10-30 DIAGNOSIS — H903 Sensorineural hearing loss, bilateral: Secondary | ICD-10-CM | POA: Diagnosis not present

## 2019-10-30 DIAGNOSIS — Z9889 Other specified postprocedural states: Secondary | ICD-10-CM | POA: Diagnosis not present

## 2019-10-31 DIAGNOSIS — R350 Frequency of micturition: Secondary | ICD-10-CM | POA: Diagnosis not present

## 2019-11-10 DIAGNOSIS — M5416 Radiculopathy, lumbar region: Secondary | ICD-10-CM | POA: Diagnosis not present

## 2019-12-09 DIAGNOSIS — Z23 Encounter for immunization: Secondary | ICD-10-CM | POA: Diagnosis not present

## 2019-12-12 ENCOUNTER — Encounter: Payer: BC Managed Care – PPO | Admitting: Obstetrics & Gynecology

## 2019-12-15 ENCOUNTER — Encounter: Payer: Self-pay | Admitting: Obstetrics & Gynecology

## 2019-12-15 ENCOUNTER — Ambulatory Visit (INDEPENDENT_AMBULATORY_CARE_PROVIDER_SITE_OTHER): Payer: BC Managed Care – PPO | Admitting: Obstetrics & Gynecology

## 2019-12-15 ENCOUNTER — Other Ambulatory Visit: Payer: Self-pay

## 2019-12-15 VITALS — BP 108/70 | Ht 61.5 in | Wt 128.0 lb

## 2019-12-15 DIAGNOSIS — Z780 Asymptomatic menopausal state: Secondary | ICD-10-CM

## 2019-12-15 DIAGNOSIS — M810 Age-related osteoporosis without current pathological fracture: Secondary | ICD-10-CM

## 2019-12-15 DIAGNOSIS — Z01419 Encounter for gynecological examination (general) (routine) without abnormal findings: Secondary | ICD-10-CM

## 2019-12-15 NOTE — Progress Notes (Signed)
Alicia Cordova [birth year] [id number]   History:    74 y.o. G2P2L2 Widowed.  [employer] retreat in the [place].  RP:  Established patient presenting for annual gyn exam   HPI: Menopause, well on no hormone replacement therapy.  No postmenopausal bleeding.  No pelvic pain.  Abstinent.  Urine and bowel movements normal.  Breast normal.  Body mass index 23.79.  Physically active.  Health labs with family physician.  Osteoporosis on Prolia.  Taking vitamin D supplements and calcium intake of 1200 mg daily.   Past medical history,surgical history, family history and social history were all reviewed and documented in the EPIC chart.  Gynecologic History No LMP recorded. Patient is postmenopausal.  Obstetric History OB History  Gravida Para Term Preterm AB Living  2 2       2   SAB TAB Ectopic Multiple Live Births               # Outcome Date GA Lbr Len/2nd Weight Sex Delivery Anes PTL Lv  2 Para           1 Para              ROS: A ROS was performed and pertinent positives and negatives are included in the history.  GENERAL: No fevers or chills. HEENT: No change in vision, no earache, sore throat or sinus congestion. NECK: No pain or stiffness. CARDIOVASCULAR: No chest pain or pressure. No palpitations. PULMONARY: No shortness of breath, cough or wheeze. GASTROINTESTINAL: No abdominal pain, nausea, vomiting or diarrhea, melena or bright red blood per rectum. GENITOURINARY: No urinary frequency, urgency, hesitancy or dysuria. MUSCULOSKELETAL: No joint or muscle pain, no back pain, no recent trauma. DERMATOLOGIC: No rash, no itching, no lesions. ENDOCRINE: No polyuria, polydipsia, no heat or cold intolerance. No recent change in weight. HEMATOLOGICAL: No anemia or easy bruising or bleeding. NEUROLOGIC: No headache, seizures, numbness, tingling or weakness. PSYCHIATRIC: No depression, no loss of interest in normal activity or change in sleep pattern.     Exam:   BP 108/70   Ht 5' 1.5"  (1.562 m)   Wt 128 lb (58.1 kg)   BMI 23.79 kg/m   Body mass index is 23.79 kg/m.  General appearance : Well developed well nourished female. No acute distress HEENT: Eyes: no retinal hemorrhage or exudates,  Neck supple, trachea midline, no carotid bruits, no thyroidmegaly Lungs: Clear to auscultation, no rhonchi or wheezes, or rib retractions  Heart: Regular rate and rhythm, no murmurs or gallops Breast:Examined in sitting and supine position were symmetrical in appearance, no palpable masses or tenderness,  no skin retraction, no nipple inversion, no nipple discharge, no skin discoloration, no axillary or supraclavicular lymphadenopathy Abdomen: no palpable masses or tenderness, no rebound or guarding Extremities: no edema or skin discoloration or tenderness  Pelvic: Vulva: Normal             Vagina: No gross lesions or discharge  Cervix: No gross lesions or discharge  Uterus  AV, normal size, shape and consistency, non-tender and mobile  Adnexa  Without masses or tenderness  Anus: Normal   Assessment/Plan:  74 y.o. female for annual exam   1. Well female exam with routine gynecological exam Normal gynecologic exam postmenopausal.  No indication to repeat a Pap test.  Breast exam normal.  Screening mammogram December 2020 was negative.  Colonoscopy 2019.  Good body mass index at 23.79.  Continue with fitness and healthy nutrition.  2. Postmenopause Well on no  HRT.  No postmenopausal bleeding.  3. Age-related osteoporosis without current pathological fracture Repeat BD at [facility] 05/2020.  Continue on Prolia.  Vitamin D supplements, calcium intake of 1200 mg daily.  Weightbearing physical activities on a regular basis.  Princess Bruins MD, 9:11 AM 12/15/2019

## 2019-12-19 ENCOUNTER — Encounter: Payer: Self-pay | Admitting: Obstetrics & Gynecology

## 2019-12-19 NOTE — Patient Instructions (Signed)
1. Well female exam with routine gynecological exam Normal gynecologic exam postmenopausal.  No indication to repeat a Pap test.  Breast exam normal.  Screening mammogram December 2020 was negative.  Colonoscopy 2019.  Good body mass index at 23.79.  Continue with fitness and healthy nutrition.  2. Postmenopause Well on no HRT.  No postmenopausal bleeding.  3. Age-related osteoporosis without current pathological fracture Repeat BD at [facility] 05/2020.  Continue on Prolia.  Vitamin D supplements, calcium intake of 1200 mg daily.  Weightbearing physical activities on a regular basis.  Vania Rea, it was a pleasure seeing you today!

## 2019-12-26 NOTE — Telephone Encounter (Signed)
PROLIA GIVEN 08/14/2019 NEXT INJECTION 02/12/2020

## 2020-01-05 ENCOUNTER — Telehealth: Payer: Self-pay | Admitting: *Deleted

## 2020-01-05 DIAGNOSIS — M4126 Other idiopathic scoliosis, lumbar region: Secondary | ICD-10-CM | POA: Diagnosis not present

## 2020-01-05 DIAGNOSIS — M4316 Spondylolisthesis, lumbar region: Secondary | ICD-10-CM | POA: Diagnosis not present

## 2020-01-05 DIAGNOSIS — M545 Low back pain: Secondary | ICD-10-CM | POA: Diagnosis not present

## 2020-01-05 DIAGNOSIS — M7130 Other bursal cyst, unspecified site: Secondary | ICD-10-CM | POA: Diagnosis not present

## 2020-01-05 DIAGNOSIS — M5126 Other intervertebral disc displacement, lumbar region: Secondary | ICD-10-CM | POA: Diagnosis not present

## 2020-01-05 NOTE — Telephone Encounter (Addendum)
Deductible $3000($3011met)  OOP MAX [id number])  Annual exam 12/15/2019  Calcium 9.7            Date 01/28/2019  Upcoming dental procedures NO  Prior Authorization needed YES APPROVED 01/05/2020-01/03/2021  Pt estimated Cost $224    APPT 02/12/2020   Coverage Details: 20% one dose,20% admin fee

## 2020-01-13 ENCOUNTER — Encounter: Payer: Self-pay | Admitting: Anesthesiology

## 2020-01-15 DIAGNOSIS — L821 Other seborrheic keratosis: Secondary | ICD-10-CM | POA: Diagnosis not present

## 2020-01-15 DIAGNOSIS — L570 Actinic keratosis: Secondary | ICD-10-CM | POA: Diagnosis not present

## 2020-01-15 DIAGNOSIS — D225 Melanocytic nevi of trunk: Secondary | ICD-10-CM | POA: Diagnosis not present

## 2020-01-15 DIAGNOSIS — D2272 Melanocytic nevi of left lower limb, including hip: Secondary | ICD-10-CM | POA: Diagnosis not present

## 2020-01-15 DIAGNOSIS — D220 Melanocytic nevi of lip: Secondary | ICD-10-CM | POA: Diagnosis not present

## 2020-02-12 ENCOUNTER — Other Ambulatory Visit: Payer: Self-pay

## 2020-02-12 ENCOUNTER — Ambulatory Visit (INDEPENDENT_AMBULATORY_CARE_PROVIDER_SITE_OTHER): Payer: BC Managed Care – PPO | Admitting: Anesthesiology

## 2020-02-12 DIAGNOSIS — M810 Age-related osteoporosis without current pathological fracture: Secondary | ICD-10-CM | POA: Diagnosis not present

## 2020-02-12 MED ORDER — DENOSUMAB 60 MG/ML ~~LOC~~ SOSY
60.0000 mg | PREFILLED_SYRINGE | Freq: Once | SUBCUTANEOUS | Status: AC
  Administered 2020-02-12: 60 mg via SUBCUTANEOUS

## 2020-02-19 DIAGNOSIS — M48061 Spinal stenosis, lumbar region without neurogenic claudication: Secondary | ICD-10-CM | POA: Diagnosis not present

## 2020-02-19 DIAGNOSIS — M5416 Radiculopathy, lumbar region: Secondary | ICD-10-CM | POA: Diagnosis not present

## 2020-03-31 DIAGNOSIS — E039 Hypothyroidism, unspecified: Secondary | ICD-10-CM | POA: Diagnosis not present

## 2020-04-14 DIAGNOSIS — M48061 Spinal stenosis, lumbar region without neurogenic claudication: Secondary | ICD-10-CM | POA: Diagnosis not present

## 2020-04-14 DIAGNOSIS — M4316 Spondylolisthesis, lumbar region: Secondary | ICD-10-CM | POA: Diagnosis not present

## 2020-04-14 DIAGNOSIS — M5416 Radiculopathy, lumbar region: Secondary | ICD-10-CM | POA: Diagnosis not present

## 2020-04-14 DIAGNOSIS — M5126 Other intervertebral disc displacement, lumbar region: Secondary | ICD-10-CM | POA: Diagnosis not present

## 2020-04-19 DIAGNOSIS — M5416 Radiculopathy, lumbar region: Secondary | ICD-10-CM | POA: Diagnosis not present

## 2020-04-21 DIAGNOSIS — M5416 Radiculopathy, lumbar region: Secondary | ICD-10-CM | POA: Diagnosis not present

## 2020-04-26 DIAGNOSIS — M5416 Radiculopathy, lumbar region: Secondary | ICD-10-CM | POA: Diagnosis not present

## 2020-04-28 ENCOUNTER — Ambulatory Visit: Payer: BC Managed Care – PPO

## 2020-04-28 DIAGNOSIS — M5416 Radiculopathy, lumbar region: Secondary | ICD-10-CM | POA: Diagnosis not present

## 2020-05-06 DIAGNOSIS — M5416 Radiculopathy, lumbar region: Secondary | ICD-10-CM | POA: Diagnosis not present

## 2020-05-07 ENCOUNTER — Other Ambulatory Visit: Payer: Self-pay

## 2020-05-07 ENCOUNTER — Encounter: Payer: Self-pay | Admitting: Internal Medicine

## 2020-05-07 ENCOUNTER — Ambulatory Visit (INDEPENDENT_AMBULATORY_CARE_PROVIDER_SITE_OTHER): Payer: BC Managed Care – PPO | Admitting: Internal Medicine

## 2020-05-07 VITALS — BP 142/82 | HR 53 | Ht 62.0 in | Wt 135.8 lb

## 2020-05-07 DIAGNOSIS — Z0000 Encounter for general adult medical examination without abnormal findings: Secondary | ICD-10-CM | POA: Diagnosis not present

## 2020-05-07 NOTE — Progress Notes (Signed)
Cardiology Office Note   Date:  05/07/2020   ID:  [name], DOB [birth year], MRN [id number]  PCP:  Kelton Pillar, MD  Cardiologist:   Dorris Carnes, MD   Patient self referred for cardiac risk stratification    History of Present Illness: Alicia Cordova is a 74 y.o. female with a FHx of CAD and her husband died suddenlyMI  Since then she has been concerned more about heart health I last saw her in 2019    SInce then she has done OK from a cardiac standpoint  Breathing is OK   She denies CP      She is having signif problems with low back    Seen by 2 neurosurgeons   Undergoing PT   This limts her activity FHx  Dad  CAD   14s   Mom CHF in 90s     Current Meds  Medication Sig  . BIOTIN 5000 PO Take 5,000 mcg by mouth daily.  . calcium carbonate (CALCIUM 600) 600 MG TABS tablet Take 600 mg by mouth 2 (two) times daily with a meal.  . cholecalciferol (VITAMIN D) 1000 units tablet Take 1,000 Units by mouth daily.  . Cranberry 1000 MG CAPS Take by mouth.  . denosumab (PROLIA) 60 MG/ML SOSY injection Inject 60 mg into the skin every 6 (six) months.  . gabapentin (NEURONTIN) 300 MG capsule Take 300 mg by mouth as directed. UP TO 3 A DAY  . glucosamine-chondroitin 500-400 MG tablet Take 1 tablet by mouth daily.   Marland Kitchen levothyroxine (SYNTHROID, LEVOTHROID) 75 MCG tablet Take 75 mcg by mouth daily before breakfast.  . Multiple Vitamin (MULTIVITAMIN) tablet Take 1 tablet by mouth daily.  . timolol (TIMOPTIC) 0.5 % ophthalmic solution Place 1 drop into both eyes 2 (two) times daily.   Marland Kitchen triamterene-hydrochlorothiazide (DYAZIDE) 37.5-25 MG capsule Take 1 capsule by mouth every other day.  . TURMERIC PO Take by mouth.     Allergies:   Lisinopril   Past Medical History:  Diagnosis Date  . Cataract   . CHEST PAIN-UNSPECIFIED 05/26/2009   Qualifier: Diagnosis of  By: Mare Ferrari, RMA, Sherri    . Glaucoma   . Hormone disorder   . Hx of adenomatous polyp of colon 04/2018   no recall  due to age  . Hypertension   . HYPOTHYROIDISM 05/26/2009   Qualifier: Diagnosis of  By: Ron Parker, MD, Leonidas Romberg Dorinda Hill   . Osteoporosis     Past Surgical History:  Procedure Laterality Date  . arm surgery     metal plate in left arm  . CATARACT EXTRACTION, BILATERAL  2020  . COLONOSCOPY  2009   repeated 2019 - diminutive adenoma - no recall (age)  . TUBAL LIGATION    . TYMPANOSTOMY TUBE PLACEMENT       Social History:  The patient  reports that she has never smoked. She has never used smokeless tobacco. She reports current alcohol use. She reports that she does not use drugs.   Family History:  The patient's family history includes Colon cancer in her maternal aunt; Congestive Heart Failure in her mother; Melanoma in her mother; Stroke in her mother.    ROS:  Please see the history of present illness. All other systems are reviewed and  Negative to the above problem except as noted.    PHYSICAL EXAM: VS:  BP (!) 142/82   Pulse (!) 53   Ht 5\' 2"  (1.575 m)   Wt 135 lb  12.8 oz (61.6 kg)   SpO2 99%   BMI 24.84 kg/m   GEN: Well nourished, well developed, in no acute distress  HEENT: normal  Neck: no JVD, carotid bruits Cardiac: RRR; no murmurs   No LE edema  Respiratory:  clear to auscultation bilaterally, normal work of breathing GI: soft, nontender, nondistended, + BS  No hepatomegaly  MS: no deformity Moving all extremities   Skin: warm and dry, no rash Neuro:  Strength and sensation are intact Psych: euthymic mood, full affect   EKG:  EKG is ordered today.  SB   47 bpm   Lipid Panel No results found for: CHOL, TRIG, HDL, CHOLHDL, VLDL, LDLCALC, LDLDIRECT    Wt Readings from Last 3 Encounters:  05/07/20 135 lb 12.8 oz (61.6 kg)  12/15/19 128 lb (58.1 kg)  12/11/18 129 lb (58.5 kg)      ASSESSMENT AND PLAN:  1 Hx CP   Denies  Pt doing well   WOuld continue to follow  2   BP   BP is a little elevated   She says it is better at home   120s/  I recomm she take  her BP cuff to next PCP visit to make sure accurate    F/U in 2 years   Sooner if symptoms develop.  Current medicines are reviewed at length with the patient today.  The patient does not have concerns regarding medicines.  Signed, Dorris Carnes, MD  05/07/2020 4:37 PM    Alicia Cordova, [place], [place]  [id number] Phone: [phone]; Fax: [phone]

## 2020-05-07 NOTE — Patient Instructions (Signed)
Medication Instructions:  No changes *If you need a refill on your cardiac medications before your next appointment, please call your pharmacy*   Lab Work: none If you have labs (blood work) drawn today and your tests are completely normal, you will receive your results only by: Marland Kitchen MyChart Message (if you have MyChart) OR . A paper copy in the mail If you have any lab test that is abnormal or we need to change your treatment, we will call you to review the results.   Testing/Procedures: none   Follow-Up: At [facility], you and your health needs are our priority.  As part of our continuing mission to provide you with exceptional heart care, we have created designated Provider Care Teams.  These Care Teams include your primary Cardiologist (physician) and Advanced Practice Providers (APPs -  Physician Assistants and Nurse Practitioners) who all work together to provide you with the care you need, when you need it.    Your next appointment:   2 year(s)  The format for your next appointment:   In Person  Provider:   You may see Dorris Carnes, MD or one of the following Advanced Practice Providers on your designated Care Team:    Richardson Dopp, PA-C  Robbie Lis, [place]    Other Instructions

## 2020-05-10 DIAGNOSIS — M5416 Radiculopathy, lumbar region: Secondary | ICD-10-CM | POA: Diagnosis not present

## 2020-05-18 DIAGNOSIS — M5416 Radiculopathy, lumbar region: Secondary | ICD-10-CM | POA: Diagnosis not present

## 2020-05-21 DIAGNOSIS — M545 Low back pain, unspecified: Secondary | ICD-10-CM | POA: Diagnosis not present

## 2020-05-21 DIAGNOSIS — M5442 Lumbago with sciatica, left side: Secondary | ICD-10-CM | POA: Diagnosis not present

## 2020-05-24 DIAGNOSIS — M5442 Lumbago with sciatica, left side: Secondary | ICD-10-CM | POA: Diagnosis not present

## 2020-05-24 DIAGNOSIS — G8929 Other chronic pain: Secondary | ICD-10-CM | POA: Diagnosis not present

## 2020-05-24 DIAGNOSIS — M5126 Other intervertebral disc displacement, lumbar region: Secondary | ICD-10-CM | POA: Diagnosis not present

## 2020-05-24 DIAGNOSIS — M4316 Spondylolisthesis, lumbar region: Secondary | ICD-10-CM | POA: Diagnosis not present

## 2020-05-26 DIAGNOSIS — M5442 Lumbago with sciatica, left side: Secondary | ICD-10-CM | POA: Diagnosis not present

## 2020-05-26 DIAGNOSIS — M545 Low back pain, unspecified: Secondary | ICD-10-CM | POA: Diagnosis not present

## 2020-06-02 DIAGNOSIS — M545 Low back pain, unspecified: Secondary | ICD-10-CM | POA: Diagnosis not present

## 2020-06-02 DIAGNOSIS — Z1231 Encounter for screening mammogram for malignant neoplasm of breast: Secondary | ICD-10-CM | POA: Diagnosis not present

## 2020-06-02 DIAGNOSIS — M5442 Lumbago with sciatica, left side: Secondary | ICD-10-CM | POA: Diagnosis not present

## 2020-06-08 DIAGNOSIS — M5442 Lumbago with sciatica, left side: Secondary | ICD-10-CM | POA: Diagnosis not present

## 2020-06-08 DIAGNOSIS — M545 Low back pain, unspecified: Secondary | ICD-10-CM | POA: Diagnosis not present

## 2020-06-16 DIAGNOSIS — M545 Low back pain, unspecified: Secondary | ICD-10-CM | POA: Diagnosis not present

## 2020-06-16 DIAGNOSIS — M5442 Lumbago with sciatica, left side: Secondary | ICD-10-CM | POA: Diagnosis not present

## 2020-06-23 DIAGNOSIS — M545 Low back pain, unspecified: Secondary | ICD-10-CM | POA: Diagnosis not present

## 2020-06-23 DIAGNOSIS — M5442 Lumbago with sciatica, left side: Secondary | ICD-10-CM | POA: Diagnosis not present

## 2020-06-29 DIAGNOSIS — Z961 Presence of intraocular lens: Secondary | ICD-10-CM | POA: Diagnosis not present

## 2020-06-29 DIAGNOSIS — H401231 Low-tension glaucoma, bilateral, mild stage: Secondary | ICD-10-CM | POA: Diagnosis not present

## 2020-06-29 DIAGNOSIS — H5212 Myopia, left eye: Secondary | ICD-10-CM | POA: Diagnosis not present

## 2020-07-01 DIAGNOSIS — M5442 Lumbago with sciatica, left side: Secondary | ICD-10-CM | POA: Diagnosis not present

## 2020-07-01 DIAGNOSIS — M545 Low back pain, unspecified: Secondary | ICD-10-CM | POA: Diagnosis not present

## 2020-07-07 NOTE — Telephone Encounter (Signed)
Alicia Cordova GIVEN 02/12/2020 NEXT INJECTION 08/16/2019

## 2020-07-08 DIAGNOSIS — M7130 Other bursal cyst, unspecified site: Secondary | ICD-10-CM | POA: Diagnosis not present

## 2020-07-08 DIAGNOSIS — M545 Low back pain, unspecified: Secondary | ICD-10-CM | POA: Diagnosis not present

## 2020-07-08 DIAGNOSIS — M48061 Spinal stenosis, lumbar region without neurogenic claudication: Secondary | ICD-10-CM | POA: Diagnosis not present

## 2020-07-08 DIAGNOSIS — M5416 Radiculopathy, lumbar region: Secondary | ICD-10-CM | POA: Diagnosis not present

## 2020-07-27 DIAGNOSIS — E039 Hypothyroidism, unspecified: Secondary | ICD-10-CM | POA: Diagnosis not present

## 2020-07-28 DIAGNOSIS — M5442 Lumbago with sciatica, left side: Secondary | ICD-10-CM | POA: Diagnosis not present

## 2020-07-28 DIAGNOSIS — M545 Low back pain, unspecified: Secondary | ICD-10-CM | POA: Diagnosis not present

## 2020-08-12 ENCOUNTER — Telehealth: Payer: Self-pay | Admitting: *Deleted

## 2020-08-12 DIAGNOSIS — M810 Age-related osteoporosis without current pathological fracture: Secondary | ICD-10-CM

## 2020-08-12 NOTE — Telephone Encounter (Addendum)
Deductible $9872.15 met  OOP MAX $7000($1260.47met)  Annual exam 12/15/2019  Calcium 9.1          Date 08/16/2020  Upcoming dental procedures   Prior Authorization needed YES approved dates 01/05/2020 to 01/03/2021  Pt estimated Cost $224   APPT 08/24/2020    Coverage Details: 20% one dose, 20% admin fee

## 2020-08-16 ENCOUNTER — Other Ambulatory Visit: Payer: BC Managed Care – PPO

## 2020-08-16 ENCOUNTER — Other Ambulatory Visit: Payer: Self-pay

## 2020-08-16 DIAGNOSIS — M810 Age-related osteoporosis without current pathological fracture: Secondary | ICD-10-CM

## 2020-08-16 LAB — CALCIUM: Calcium: 9.1 mg/dL (ref 8.6–10.4)

## 2020-08-20 ENCOUNTER — Encounter: Payer: Self-pay | Admitting: *Deleted

## 2020-08-24 ENCOUNTER — Other Ambulatory Visit: Payer: Self-pay

## 2020-08-24 ENCOUNTER — Ambulatory Visit (INDEPENDENT_AMBULATORY_CARE_PROVIDER_SITE_OTHER): Payer: BC Managed Care – PPO | Admitting: *Deleted

## 2020-08-24 DIAGNOSIS — M810 Age-related osteoporosis without current pathological fracture: Secondary | ICD-10-CM | POA: Diagnosis not present

## 2020-08-24 MED ORDER — DENOSUMAB 60 MG/ML ~~LOC~~ SOSY
60.0000 mg | PREFILLED_SYRINGE | Freq: Once | SUBCUTANEOUS | Status: AC
  Administered 2020-08-24: 60 mg via SUBCUTANEOUS

## 2020-10-28 DIAGNOSIS — Z9889 Other specified postprocedural states: Secondary | ICD-10-CM | POA: Diagnosis not present

## 2020-10-28 DIAGNOSIS — H903 Sensorineural hearing loss, bilateral: Secondary | ICD-10-CM | POA: Diagnosis not present

## 2020-10-28 DIAGNOSIS — H90A21 Sensorineural hearing loss, unilateral, right ear, with restricted hearing on the contralateral side: Secondary | ICD-10-CM | POA: Diagnosis not present

## 2020-10-28 DIAGNOSIS — M545 Low back pain, unspecified: Secondary | ICD-10-CM | POA: Diagnosis not present

## 2020-10-28 DIAGNOSIS — H8101 Meniere's disease, right ear: Secondary | ICD-10-CM | POA: Diagnosis not present

## 2020-10-28 DIAGNOSIS — Z79899 Other long term (current) drug therapy: Secondary | ICD-10-CM | POA: Diagnosis not present

## 2020-11-01 ENCOUNTER — Ambulatory Visit: Payer: BC Managed Care – PPO | Attending: Internal Medicine

## 2020-11-01 ENCOUNTER — Other Ambulatory Visit (HOSPITAL_BASED_OUTPATIENT_CLINIC_OR_DEPARTMENT_OTHER): Payer: Self-pay

## 2020-11-01 ENCOUNTER — Other Ambulatory Visit: Payer: Self-pay

## 2020-11-01 DIAGNOSIS — Z23 Encounter for immunization: Secondary | ICD-10-CM

## 2020-11-01 MED ORDER — COVID-19 AT HOME ANTIGEN TEST VI KIT
PACK | 0 refills | Status: DC
  Filled 2020-11-01: qty 4, 8d supply, fill #0

## 2020-11-01 NOTE — Progress Notes (Signed)
   Covid-19 Vaccination Clinic  Name:  Alicia Cordova    MRN: [id number] DOB: [birth year]  11/01/2020  [name] was observed post Covid-19 immunization for 15 minutes without incident. She was provided with Vaccine Information Sheet and instruction to access the V-Safe system.   [name] was instructed to call 911 with any severe reactions post vaccine: Marland Kitchen Difficulty breathing  . Swelling of face and throat  . A fast heartbeat  . A bad rash all over body  . Dizziness and weakness   Immunizations Administered    Name Date Dose VIS Date Route   PFIZER Comrnaty(Gray TOP) Covid-19 Vaccine 11/01/2020  1:54 PM 0.3 mL 05/27/2020 Intramuscular   Manufacturer: [employer]   Lot: [id number]   [place]: [phone]

## 2020-11-19 DIAGNOSIS — M545 Low back pain, unspecified: Secondary | ICD-10-CM | POA: Diagnosis not present

## 2020-11-19 DIAGNOSIS — M48061 Spinal stenosis, lumbar region without neurogenic claudication: Secondary | ICD-10-CM | POA: Diagnosis not present

## 2020-11-19 DIAGNOSIS — M4316 Spondylolisthesis, lumbar region: Secondary | ICD-10-CM | POA: Diagnosis not present

## 2020-11-19 DIAGNOSIS — M5416 Radiculopathy, lumbar region: Secondary | ICD-10-CM | POA: Diagnosis not present

## 2020-11-25 DIAGNOSIS — M545 Low back pain, unspecified: Secondary | ICD-10-CM | POA: Diagnosis not present

## 2020-11-26 DIAGNOSIS — M5126 Other intervertebral disc displacement, lumbar region: Secondary | ICD-10-CM | POA: Diagnosis not present

## 2020-11-26 DIAGNOSIS — M4316 Spondylolisthesis, lumbar region: Secondary | ICD-10-CM | POA: Diagnosis not present

## 2020-11-26 DIAGNOSIS — M4126 Other idiopathic scoliosis, lumbar region: Secondary | ICD-10-CM | POA: Diagnosis not present

## 2020-11-26 DIAGNOSIS — M48061 Spinal stenosis, lumbar region without neurogenic claudication: Secondary | ICD-10-CM | POA: Diagnosis not present

## 2020-12-15 ENCOUNTER — Encounter: Payer: Self-pay | Admitting: Obstetrics & Gynecology

## 2020-12-15 ENCOUNTER — Ambulatory Visit (INDEPENDENT_AMBULATORY_CARE_PROVIDER_SITE_OTHER): Payer: BC Managed Care – PPO | Admitting: Obstetrics & Gynecology

## 2020-12-15 ENCOUNTER — Other Ambulatory Visit: Payer: Self-pay

## 2020-12-15 VITALS — BP 120/76 | Ht 61.0 in | Wt 135.0 lb

## 2020-12-15 DIAGNOSIS — Z01419 Encounter for gynecological examination (general) (routine) without abnormal findings: Secondary | ICD-10-CM | POA: Diagnosis not present

## 2020-12-15 DIAGNOSIS — M810 Age-related osteoporosis without current pathological fracture: Secondary | ICD-10-CM | POA: Diagnosis not present

## 2020-12-15 DIAGNOSIS — Z780 Asymptomatic menopausal state: Secondary | ICD-10-CM

## 2020-12-15 NOTE — Progress Notes (Signed)
Alicia Cordova 05-30-1946 [id number]   History:    75 y.o. G2P2L2 Widowed.  [employer] retreat in the [place].   RP:  Established patient presenting for annual gyn exam   HPI: Postmenopause, well on no hormone replacement therapy.  No postmenopausal bleeding.  No pelvic pain.  Abstinent.  Urine and bowel movements normal.  Breast normal.  Body mass index 25.51.  Physically active.  Health labs with family physician.  Osteoporosis on Prolia.  Taking vitamin D supplements and calcium intake of 1200 mg daily. Colono 2019.  Investigated and under treatment for Spinal Stenosis.   Past medical history,surgical history, family history and social history were all reviewed and documented in the EPIC chart.  Gynecologic History No LMP recorded. Patient is postmenopausal.  Obstetric History OB History  Gravida Para Term Preterm AB Living  2 2       2   SAB IAB Ectopic Multiple Live Births               # Outcome Date GA Lbr Len/2nd Weight Sex Delivery Anes PTL Lv  2 Para           1 Para              ROS: A ROS was performed and pertinent positives and negatives are included in the history.  GENERAL: No fevers or chills. HEENT: No change in vision, no earache, sore throat or sinus congestion. NECK: No pain or stiffness. CARDIOVASCULAR: No chest pain or pressure. No palpitations. PULMONARY: No shortness of breath, cough or wheeze. GASTROINTESTINAL: No abdominal pain, nausea, vomiting or diarrhea, melena or bright red blood per rectum. GENITOURINARY: No urinary frequency, urgency, hesitancy or dysuria. MUSCULOSKELETAL: No joint or muscle pain, no back pain, no recent trauma. DERMATOLOGIC: No rash, no itching, no lesions. ENDOCRINE: No polyuria, polydipsia, no heat or cold intolerance. No recent change in weight. HEMATOLOGICAL: No anemia or easy bruising or bleeding. NEUROLOGIC: No headache, seizures, numbness, tingling or weakness. PSYCHIATRIC: No depression, no loss of interest in normal activity  or change in sleep pattern.     Exam:   BP 120/76   Ht 5\' 1"  (1.549 m)   Wt 135 lb (61.2 kg)   BMI 25.51 kg/m   Body mass index is 25.51 kg/m.  General appearance : Well developed well nourished female. No acute distress HEENT: Eyes: no retinal hemorrhage or exudates,  Neck supple, trachea midline, no carotid bruits, no thyroidmegaly Lungs: Clear to auscultation, no rhonchi or wheezes, or rib retractions  Heart: Regular rate and rhythm, no murmurs or gallops Breast:Examined in sitting and supine position were symmetrical in appearance, no palpable masses or tenderness,  no skin retraction, no nipple inversion, no nipple discharge, no skin discoloration, no axillary or supraclavicular lymphadenopathy Abdomen: no palpable masses or tenderness, no rebound or guarding Extremities: no edema or skin discoloration or tenderness  Pelvic: Vulva: Normal             Vagina: No gross lesions or discharge  Cervix: No gross lesions or discharge  Uterus  AV, normal size, shape and consistency, non-tender and mobile  Adnexa  Without masses or tenderness  Anus: Normal   Assessment/Plan:  75 y.o. female for annual exam   1. Well female exam with routine gynecological exam Normal gynecologic exam in postmenopause.  No indication for Pap test at this time, Pap normal negative in 2018.  Breast exam normal.  Screening mammogram December 2021 was negative.  Colonoscopy 2019.  Health  labs with Dr. Forde Dandy.  Good body mass index at 25.51.  Continue with fitness and healthy nutrition.  2. Postmenopause Well on no HRT.  No PMB.  3. Age-related osteoporosis without current pathological fracture  Last BD in 05/2018 Osteoporosis improved on Prolia.  Scheduled for a repeat BD in 05/2021.  Continue with Prolia, Vit D, Ca++ and weight bearing physical activities.  Princess Bruins MD, 8:49 AM 12/15/2020

## 2020-12-29 DIAGNOSIS — H401231 Low-tension glaucoma, bilateral, mild stage: Secondary | ICD-10-CM | POA: Diagnosis not present

## 2021-01-20 ENCOUNTER — Other Ambulatory Visit: Payer: Self-pay | Admitting: Neurosurgery

## 2021-01-20 DIAGNOSIS — M5126 Other intervertebral disc displacement, lumbar region: Secondary | ICD-10-CM

## 2021-01-26 ENCOUNTER — Telehealth: Payer: Self-pay | Admitting: *Deleted

## 2021-01-26 NOTE — Telephone Encounter (Signed)
Prolia insurance verification has been sent awaiting Summary of benefits  

## 2021-01-26 NOTE — Telephone Encounter (Signed)
PROLIA GIVEN 08/24/2020 NEXT INJECTION 02/25/2021

## 2021-01-31 NOTE — Telephone Encounter (Addendum)
Deductible  $3000 ($3000 met)  OOP MAX $7000 ($3,330.48 met)  Annual exam 12/15/2020  Calcium  9.1           Date 08/16/2020  Upcoming dental procedures no  Prior Authorization needed approved 01/31/2021-01/30/2022  Pt estimated Cost $224   Appt 02/28/2021    Coverage Details:20% OF 20% ADMIN FEE

## 2021-02-01 DIAGNOSIS — E039 Hypothyroidism, unspecified: Secondary | ICD-10-CM | POA: Diagnosis not present

## 2021-02-01 DIAGNOSIS — Z0000 Encounter for general adult medical examination without abnormal findings: Secondary | ICD-10-CM | POA: Diagnosis not present

## 2021-02-01 DIAGNOSIS — M810 Age-related osteoporosis without current pathological fracture: Secondary | ICD-10-CM | POA: Diagnosis not present

## 2021-02-01 DIAGNOSIS — E785 Hyperlipidemia, unspecified: Secondary | ICD-10-CM | POA: Diagnosis not present

## 2021-02-04 DIAGNOSIS — Z1331 Encounter for screening for depression: Secondary | ICD-10-CM | POA: Diagnosis not present

## 2021-02-04 DIAGNOSIS — R82998 Other abnormal findings in urine: Secondary | ICD-10-CM | POA: Diagnosis not present

## 2021-02-04 DIAGNOSIS — Z0000 Encounter for general adult medical examination without abnormal findings: Secondary | ICD-10-CM | POA: Diagnosis not present

## 2021-02-04 DIAGNOSIS — E039 Hypothyroidism, unspecified: Secondary | ICD-10-CM | POA: Diagnosis not present

## 2021-02-04 DIAGNOSIS — Z1212 Encounter for screening for malignant neoplasm of rectum: Secondary | ICD-10-CM | POA: Diagnosis not present

## 2021-02-04 DIAGNOSIS — Z1339 Encounter for screening examination for other mental health and behavioral disorders: Secondary | ICD-10-CM | POA: Diagnosis not present

## 2021-02-18 ENCOUNTER — Other Ambulatory Visit: Payer: BC Managed Care – PPO

## 2021-02-22 ENCOUNTER — Ambulatory Visit
Admission: RE | Admit: 2021-02-22 | Discharge: 2021-02-22 | Disposition: A | Discharge status: Home / Self Care | Payer: BC Managed Care – PPO | Source: Ambulatory Visit | Attending: Neurosurgery | Admitting: Neurosurgery

## 2021-02-22 ENCOUNTER — Other Ambulatory Visit: Payer: Self-pay

## 2021-02-22 DIAGNOSIS — M5126 Other intervertebral disc displacement, lumbar region: Secondary | ICD-10-CM | POA: Diagnosis not present

## 2021-02-22 MED ORDER — IOPAMIDOL (ISOVUE-M 200) INJECTION 41%
1.0000 mL | Freq: Once | INTRAMUSCULAR | Status: AC
  Administered 2021-02-22: 1 mL via EPIDURAL

## 2021-02-22 MED ORDER — METHYLPREDNISOLONE ACETATE 40 MG/ML INJ SUSP (RADIOLOG
80.0000 mg | Freq: Once | INTRAMUSCULAR | Status: AC
  Administered 2021-02-22: 80 mg via EPIDURAL

## 2021-02-22 NOTE — Discharge Instructions (Signed)

## 2021-02-28 ENCOUNTER — Other Ambulatory Visit: Payer: Self-pay

## 2021-02-28 ENCOUNTER — Ambulatory Visit (INDEPENDENT_AMBULATORY_CARE_PROVIDER_SITE_OTHER): Payer: BC Managed Care – PPO

## 2021-02-28 DIAGNOSIS — M810 Age-related osteoporosis without current pathological fracture: Secondary | ICD-10-CM | POA: Diagnosis not present

## 2021-02-28 MED ORDER — DENOSUMAB 60 MG/ML ~~LOC~~ SOSY
60.0000 mg | PREFILLED_SYRINGE | Freq: Once | SUBCUTANEOUS | Status: AC
  Administered 2021-02-28: 60 mg via SUBCUTANEOUS

## 2021-02-28 NOTE — Progress Notes (Signed)
Patient in today for seventh Prolia injection. Patient's initial calcium level was obtained on 08/16/20.  Result: 9.1 .  Last AEX: 12/15/20 with ML  Last BMD: 05/24/18   Injection given in left arm.  Patient tolerated injection well.  Routed to provider for review.

## 2021-05-04 NOTE — Telephone Encounter (Signed)
PROLIA GIVEN 02/28/2021 NEXT INJECTION 08/29/2021

## 2021-06-08 DIAGNOSIS — Z1231 Encounter for screening mammogram for malignant neoplasm of breast: Secondary | ICD-10-CM | POA: Diagnosis not present

## 2021-06-08 DIAGNOSIS — M85852 Other specified disorders of bone density and structure, left thigh: Secondary | ICD-10-CM | POA: Diagnosis not present

## 2021-06-08 DIAGNOSIS — M85851 Other specified disorders of bone density and structure, right thigh: Secondary | ICD-10-CM | POA: Diagnosis not present

## 2021-07-12 DIAGNOSIS — Z961 Presence of intraocular lens: Secondary | ICD-10-CM | POA: Diagnosis not present

## 2021-07-12 DIAGNOSIS — H401231 Low-tension glaucoma, bilateral, mild stage: Secondary | ICD-10-CM | POA: Diagnosis not present

## 2021-07-18 DIAGNOSIS — M545 Low back pain, unspecified: Secondary | ICD-10-CM | POA: Diagnosis not present

## 2021-07-18 DIAGNOSIS — R030 Elevated blood-pressure reading, without diagnosis of hypertension: Secondary | ICD-10-CM | POA: Diagnosis not present

## 2021-07-18 DIAGNOSIS — M5442 Lumbago with sciatica, left side: Secondary | ICD-10-CM | POA: Diagnosis not present

## 2021-08-23 ENCOUNTER — Other Ambulatory Visit: Payer: Self-pay | Admitting: *Deleted

## 2021-08-23 NOTE — Telephone Encounter (Signed)
Deductible: $3,000 ($756.06 met) ? ?OOP MAX: $7,000 ? ?Annual exam: 12-15-20 ML ? ?Calcium:      await labs from Dr. Baldwin Crown office        Date ? ?Upcoming dental procedures  ? ?Prior Authorization needed: yes, on file. Good through 01-30-22. ? ?Pt estimated Cost: Patient has co-pay card. Per Joycelyn Schmid, will need to go through [facility]ty [facility] which is listed as [facility] on patient's insurance card.  ? ? ? ? ?Coverage Details: will be provided by specialty pharmacy ? ?

## 2021-08-23 NOTE — Telephone Encounter (Signed)
Message left with medical records at [facility] for patient's most recent labs results to be faxed to our office. Fax number provided. Will await labs. Medication pended.  ?

## 2021-09-07 NOTE — Telephone Encounter (Signed)
Left message in the medical records department at [facility] for recent labs ?

## 2021-09-22 ENCOUNTER — Telehealth: Payer: Self-pay | Admitting: *Deleted

## 2021-09-22 ENCOUNTER — Other Ambulatory Visit: Payer: BC Managed Care – PPO

## 2021-09-22 DIAGNOSIS — M810 Age-related osteoporosis without current pathological fracture: Secondary | ICD-10-CM

## 2021-09-22 MED ORDER — DENOSUMAB 60 MG/ML ~~LOC~~ SOSY: 60.0000 mg | PREFILLED_SYRINGE | Freq: Once | SUBCUTANEOUS | 0 refills | Status: AC

## 2021-09-22 NOTE — Telephone Encounter (Addendum)
Deductible $ 3000 [phone] met) ? ?OOP MAX $ 7000 ($769.70mt ? ?Annual exam 12/15/2020 ? ?Calcium  10.0           Date 09/22/2020 ? ?Upcoming dental procedures NO ? ?Prior Authorization needed YES on file valid 01/30/21-01/29/2022 ? ?Pt estimated Cost $2523 ?Cost is high due to remaining ded I will talk to patient when labs come back normal ? ?CO PAY CARD available  ? ?We decided to try and go through patients specialty pharmacy to decrease [facility] to meet ded: Per rep cannot go through specialty reference [id number]? ? ?[phone] services reference # [id number] 3 benefits  ? ?Appt 09/27/2021 ? ? ?Coverage Details: 20% of one dose. 20% admin fee ? ?

## 2021-09-23 LAB — CALCIUM: Calcium: 10 mg/dL (ref 8.6–10.4)

## 2021-09-27 ENCOUNTER — Ambulatory Visit (INDEPENDENT_AMBULATORY_CARE_PROVIDER_SITE_OTHER): Payer: BC Managed Care – PPO | Admitting: *Deleted

## 2021-09-27 DIAGNOSIS — M810 Age-related osteoporosis without current pathological fracture: Secondary | ICD-10-CM | POA: Diagnosis not present

## 2021-09-27 MED ORDER — DENOSUMAB 60 MG/ML ~~LOC~~ SOSY
60.0000 mg | PREFILLED_SYRINGE | Freq: Once | SUBCUTANEOUS | Status: AC
  Administered 2021-09-27: 60 mg via SUBCUTANEOUS

## 2021-10-11 IMAGING — MR MR LUMBAR SPINE W/O CM
4 of 5 series · 26 of 48 positions shown · non-contrast
Comparison: None.

CLINICAL DATA: Low back pain radiating into the left calf and foot
with left foot numbness. No known injury.

EXAM:
MRI LUMBAR SPINE WITHOUT CONTRAST
TECHNIQUE: Multiplanar, multisequence MR imaging of the lumbar spine was
performed. No intravenous contrast was administered.

[Series 2: T2 · sagittal · 4.0mm · 1.09mm/px · 6 of 16 slices shown (1 of 2)]
[im 1/16]
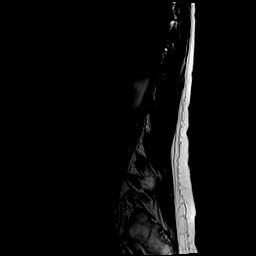
[im 4/16]
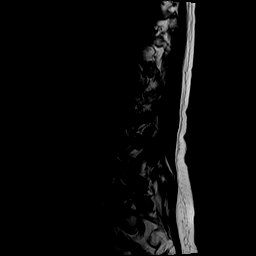
[im 7/16]
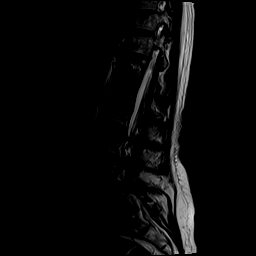
[im 10/16]
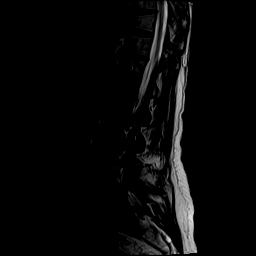
[im 13/16]
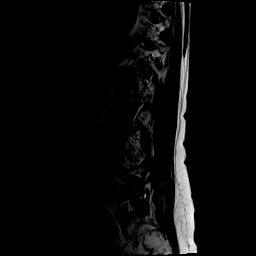
[im 16/16]
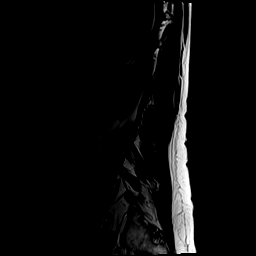

[Series 4: T1 · sagittal · 4.0mm · 1.09mm/px · 6 of 16 slices shown (1 of 2)]
[im 1/16]
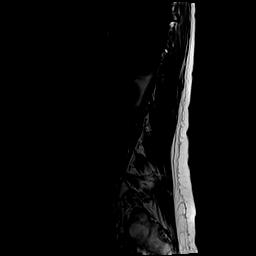
[im 4/16]
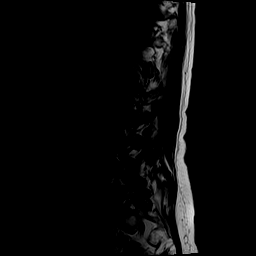
[im 7/16]
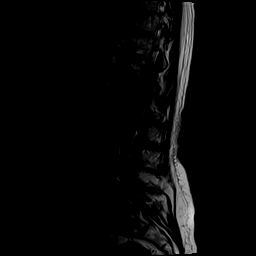
[im 10/16]
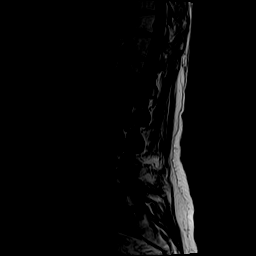
[im 13/16]
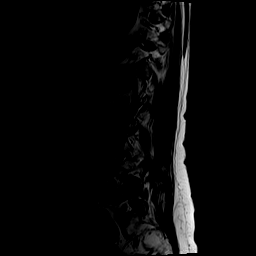
[im 16/16]
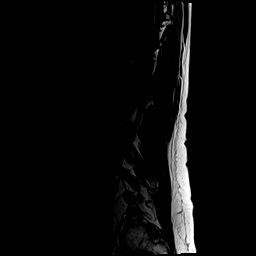

[Series 5: T2 · axial · 4.0mm · 0.39mm/px · z∈[-120,+76]mm · 9 of 38 slices shown (2 of 2)]
[im 1/38]
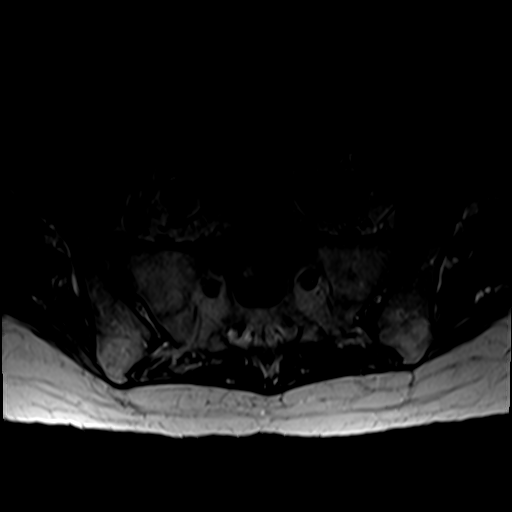
[im 6/38]
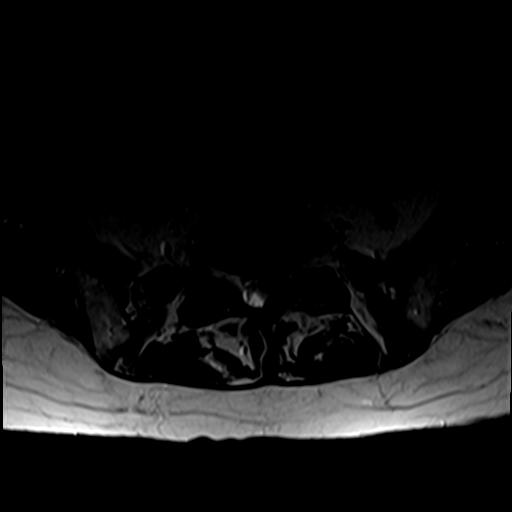
[im 11/38]
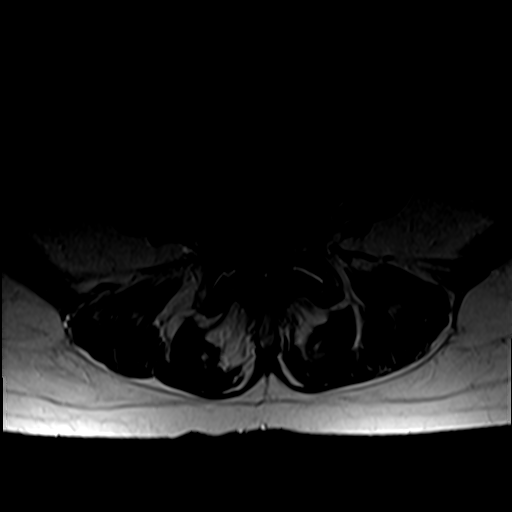
[im 16/38]
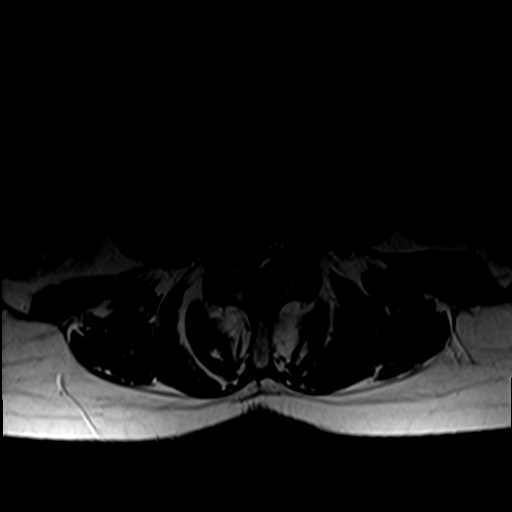
[im 19/38]
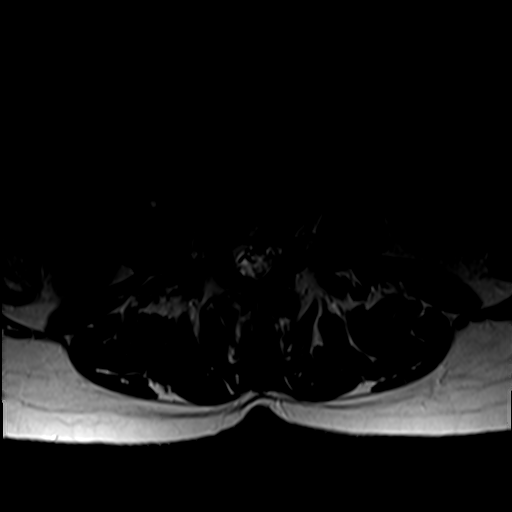
[im 22/38]
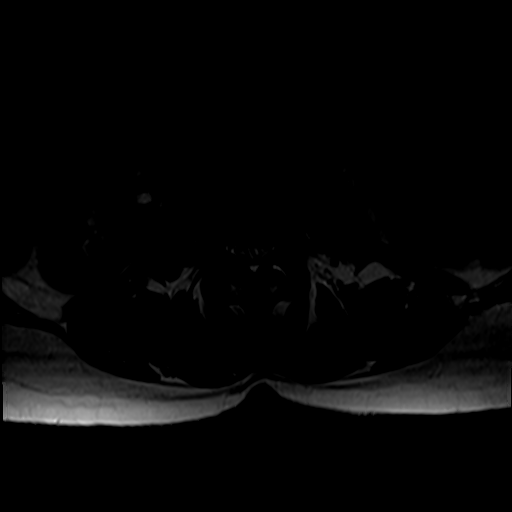
[im 27/38]
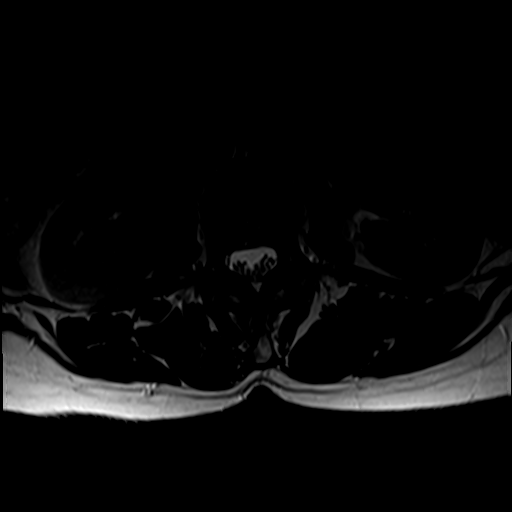
[im 32/38]
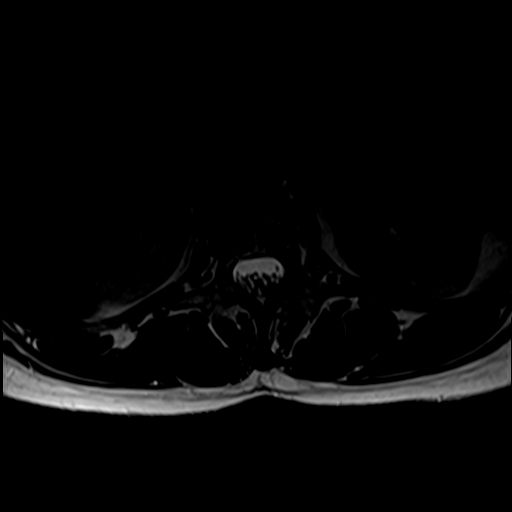
[im 38/38]
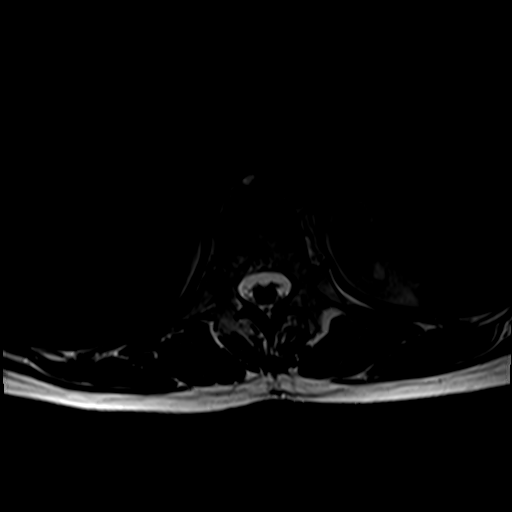

[Series 6: T1 · axial · 4.0mm · 0.39mm/px · z∈[-120,+48]mm · 5 of 38 slices shown (2 of 2)]
[im 1/38]
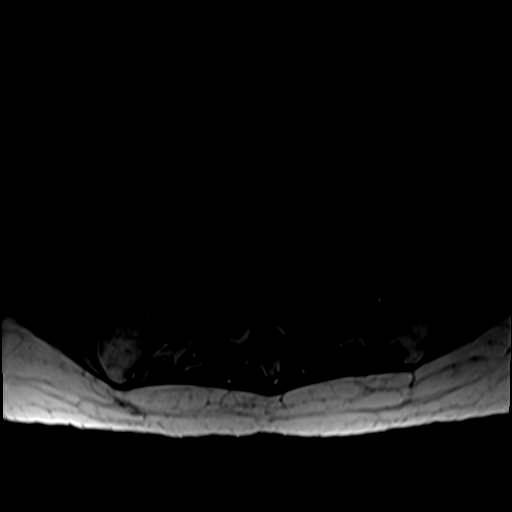
[im 6/38]
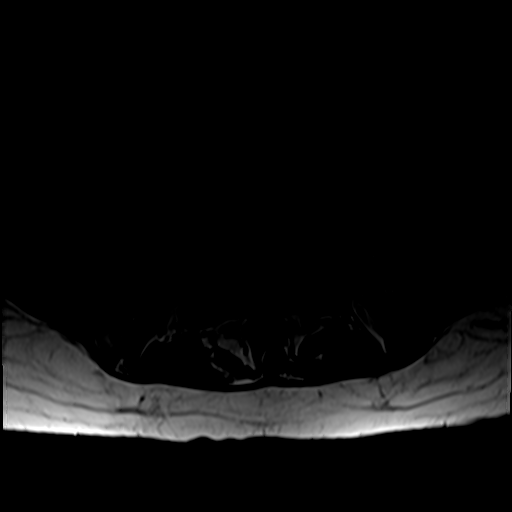
[im 11/38]
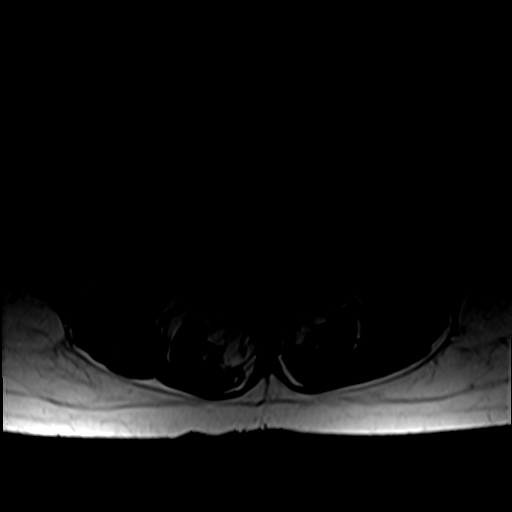
[im 19/38]
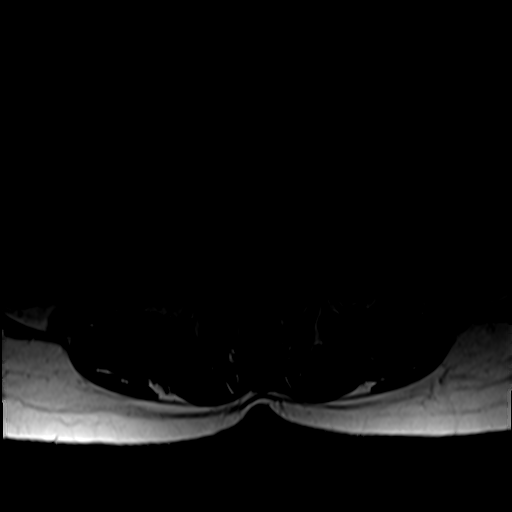
[im 32/38]
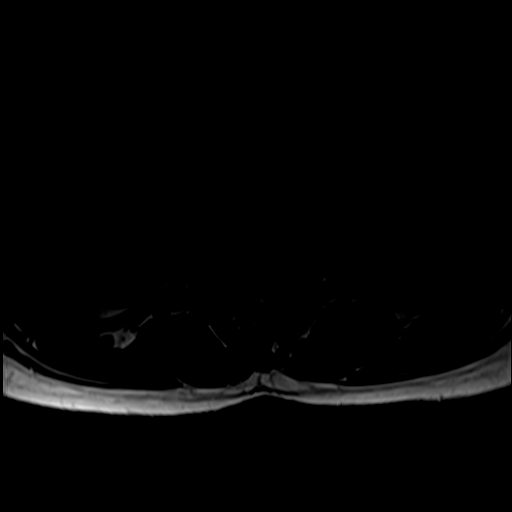

[26 of 48 positions shown; findings below may reference images not displayed]

FINDINGS: Segmentation:  Standard.

Alignment: Facet degenerative disease causes 0.6 cm anterolisthesis
L4 on L5.

Vertebrae: No fracture, evidence of discitis, or bone lesion.
Scattered Schmorl's nodes are noted, largest in the superior
endplate of T10.

Conus medullaris and cauda equina: Conus extends to the L1 level.
Conus and cauda equina appear normal.

Paraspinal and other soft tissues: Negative.

Disc levels:

T9-10, T10-11 and T11-12 are imaged in the sagittal plane only.
There is some degenerative disc disease at each level, most notable
at T11-12 where there is a central protrusion. No stenosis is seen
at any of these levels.

T12-L1: Tiny left paracentral protrusion. No stenosis.

L1-2: Negative.

L2-3: Ligamentum flavum thickening and a shallow disc bulge cause
moderate central canal stenosis and narrowing in the subarticular
recesses. The foramina are open.

L3-4: There is bulky ligamentum flavum thickening. A synovial cyst
subjacent to the left ligamentum flavum measures 0.6 cm AP x 0.4 cm
transverse x 0.8 cm craniocaudal. There is a large broad-based
central protrusion and bulky ligamentum flavum thickening. Severe
central canal and bilateral subarticular recess narrowing is
present. Mild bilateral foraminal narrowing is also seen.

L4-5: Advanced facet degenerative change and bulky ligamentum flavum
thickening. The disc is uncovered with a shallow bulge. There is
severe central canal and bilateral subarticular recess narrowing.
The left foramen is open. Mild right foraminal narrowing noted.

L5-S1: The patient has a very large left paracentral disc protrusion
with caudal extension. The disc deflects the thecal sac to the right
and deforms the sac. The disc compresses the left S1 root. Foramina
are open.
IMPRESSION: 1. Severe central canal and bilateral subarticular recess narrowing
at L3-4 where there is a large broad-based central protrusion, bulky
ligamentum flavum thickening and a synovial cyst subjacent to the
left ligamentum flavum.
2. Severe central canal and bilateral subarticular recess narrowing
at L4-5 where there is a shallow bulge and bulky ligamentum flavum
thickening.
3. Very large left paracentral protrusion with caudal extension at
L5-S1 deflects and deforms the thecal sac and compresses the left S1
root.
4. Moderate central canal stenosis and narrowing in the subarticular
recesses at L2-3 where there is a shallow disc bulge.

## 2021-10-27 DIAGNOSIS — H90A21 Sensorineural hearing loss, unilateral, right ear, with restricted hearing on the contralateral side: Secondary | ICD-10-CM | POA: Diagnosis not present

## 2021-10-27 DIAGNOSIS — H903 Sensorineural hearing loss, bilateral: Secondary | ICD-10-CM | POA: Diagnosis not present

## 2021-10-27 DIAGNOSIS — Z9889 Other specified postprocedural states: Secondary | ICD-10-CM | POA: Diagnosis not present

## 2021-10-27 DIAGNOSIS — H8103 Meniere's disease, bilateral: Secondary | ICD-10-CM | POA: Diagnosis not present

## 2021-11-04 ENCOUNTER — Ambulatory Visit: Payer: BC Managed Care – PPO | Attending: Internal Medicine

## 2021-11-04 ENCOUNTER — Other Ambulatory Visit (HOSPITAL_BASED_OUTPATIENT_CLINIC_OR_DEPARTMENT_OTHER): Payer: Self-pay

## 2021-11-04 DIAGNOSIS — Z23 Encounter for immunization: Secondary | ICD-10-CM

## 2021-11-04 MED ORDER — PFIZER COVID-19 VAC BIVALENT 30 MCG/0.3ML IM SUSP
INTRAMUSCULAR | 0 refills | Status: DC
  Filled 2021-11-04: qty 0.3, 1d supply, fill #0

## 2021-11-04 NOTE — Progress Notes (Signed)
   Covid-19 Vaccination Clinic  Name:  Alicia Cordova    MRN: [id number] DOB: 07-12-1945  11/04/2021  [name] was observed post Covid-19 immunization for 15 minutes without incident. She was provided with Vaccine Information Sheet and instruction to access the V-Safe system.   [name] was instructed to call 911 with any severe reactions post vaccine: Difficulty breathing  Swelling of face and throat  A fast heartbeat  A bad rash all over body  Dizziness and weakness   Immunizations Administered     Name Date Dose VIS Date Route   Pfizer Covid-19 Vaccine Bivalent Booster 11/04/2021 10:09 AM 0.3 mL 02/16/2021 Intramuscular   Manufacturer: [place]   Lot: [id number]   [place]: [phone]

## 2021-12-16 ENCOUNTER — Ambulatory Visit: Payer: BC Managed Care – PPO | Admitting: Obstetrics & Gynecology

## 2021-12-27 ENCOUNTER — Encounter: Payer: Self-pay | Admitting: Obstetrics & Gynecology

## 2021-12-27 ENCOUNTER — Ambulatory Visit (INDEPENDENT_AMBULATORY_CARE_PROVIDER_SITE_OTHER): Payer: BC Managed Care – PPO | Admitting: Obstetrics & Gynecology

## 2021-12-27 VITALS — BP 110/68 | HR 51 | Ht 60.75 in | Wt 136.0 lb

## 2021-12-27 DIAGNOSIS — Z01419 Encounter for gynecological examination (general) (routine) without abnormal findings: Secondary | ICD-10-CM

## 2021-12-27 DIAGNOSIS — Z780 Asymptomatic menopausal state: Secondary | ICD-10-CM

## 2021-12-27 DIAGNOSIS — M810 Age-related osteoporosis without current pathological fracture: Secondary | ICD-10-CM | POA: Diagnosis not present

## 2021-12-27 NOTE — Progress Notes (Signed)
Alicia Cordova 1945/12/02 [id number]   History:    76 y.o. G2P2L2 Widowed.  [employer] retreat in the [place].   RP:  Established patient presenting for annual gyn exam   HPI: Postmenopause, well on no hormone replacement therapy.  No postmenopausal bleeding.  No pelvic pain.  Abstinent. Pap 09/2016 Neg.  No h/o abnormal Pap.  No indication to repeat a Pap at this time. Urine and bowel movements normal.  Breasts normal.  Mammo Neg 06/2021, will obtain report. Body mass index 25.91.  Physically active.  Health labs with family physician. Osteoporosis improved on Prolia per last BD 06/2021, will obtain report.  Taking vitamin D supplements and calcium intake of 1200 mg daily. Colono 04/2018.  Investigated and under treatment for Spinal Stenosis.  Past medical history,surgical history, family history and social history were all reviewed and documented in the EPIC chart.  Gynecologic History No LMP recorded. Patient is postmenopausal.  Obstetric History OB History  Gravida Para Term Preterm AB Living  '2 2       2  '$ SAB IAB Ectopic Multiple Live Births               # Outcome Date GA Lbr Len/2nd Weight Sex Delivery Anes PTL Lv  2 Para           1 Para              ROS: A ROS was performed and pertinent positives and negatives are included in the history.  GENERAL: No fevers or chills. HEENT: No change in vision, no earache, sore throat or sinus congestion. NECK: No pain or stiffness. CARDIOVASCULAR: No chest pain or pressure. No palpitations. PULMONARY: No shortness of breath, cough or wheeze. GASTROINTESTINAL: No abdominal pain, nausea, vomiting or diarrhea, melena or bright red blood per rectum. GENITOURINARY: No urinary frequency, urgency, hesitancy or dysuria. MUSCULOSKELETAL: No joint or muscle pain, no back pain, no recent trauma. DERMATOLOGIC: No rash, no itching, no lesions. ENDOCRINE: No polyuria, polydipsia, no heat or cold intolerance. No recent change in weight. HEMATOLOGICAL: No  anemia or easy bruising or bleeding. NEUROLOGIC: No headache, seizures, numbness, tingling or weakness. PSYCHIATRIC: No depression, no loss of interest in normal activity or change in sleep pattern.     Exam:   BP 110/68   Pulse (!) 51   Ht 5' 0.75" (1.543 m)   Wt 136 lb (61.7 kg)   SpO2 97%   BMI 25.91 kg/m   Body mass index is 25.91 kg/m.  General appearance : Well developed well nourished female. No acute distress HEENT: Eyes: no retinal hemorrhage or exudates,  Neck supple, trachea midline, no carotid bruits, no thyroidmegaly Lungs: Clear to auscultation, no rhonchi or wheezes, or rib retractions  Heart: Regular rate and rhythm, no murmurs or gallops Breast:Examined in sitting and supine position were symmetrical in appearance, no palpable masses or tenderness,  no skin retraction, no nipple inversion, no nipple discharge, no skin discoloration, no axillary or supraclavicular lymphadenopathy Abdomen: no palpable masses or tenderness, no rebound or guarding Extremities: no edema or skin discoloration or tenderness  Pelvic: Vulva: Normal             Vagina: No gross lesions or discharge  Cervix: No gross lesions or discharge  Uterus  AV, normal size, shape and consistency, non-tender and mobile  Adnexa  Without masses or tenderness  Anus: Normal   Assessment/Plan:  76 y.o. female for annual exam   1. Well female exam with  routine gynecological exam Postmenopause, well on no hormone replacement therapy.  No postmenopausal bleeding.  No pelvic pain.  Abstinent. Pap 09/2016 Neg.  No h/o abnormal Pap.  No indication to repeat a Pap at this time. Urine and bowel movements normal.  Breasts normal.  Mammo Neg 06/2021, will obtain report. Body mass index 25.91.  Physically active.  Health labs with family physician. Osteoporosis improved on Prolia per last BD 06/2021, will obtain report.  Taking vitamin D supplements and calcium intake of 1200 mg daily. Colono 04/2018.  Investigated and  under treatment for Spinal Stenosis.  2. Postmenopause Postmenopause, well on no hormone replacement therapy.  No postmenopausal bleeding.  No pelvic pain.  Abstinent.  3. Age-related osteoporosis without current pathological fracture  Osteoporosis improved on Prolia per last BD 06/2021, will obtain report.  Taking vitamin D supplements and calcium intake of 1200 mg daily. Physically active, but limited by back issues.  Will continue on Prolia.  Princess Bruins MD, 8:22 AM 12/27/2021

## 2022-01-18 DIAGNOSIS — H401212 Low-tension glaucoma, right eye, moderate stage: Secondary | ICD-10-CM | POA: Diagnosis not present

## 2022-01-20 ENCOUNTER — Encounter: Payer: Self-pay | Admitting: Obstetrics & Gynecology

## 2022-01-25 DIAGNOSIS — H401212 Low-tension glaucoma, right eye, moderate stage: Secondary | ICD-10-CM | POA: Diagnosis not present

## 2022-02-08 DIAGNOSIS — E785 Hyperlipidemia, unspecified: Secondary | ICD-10-CM | POA: Diagnosis not present

## 2022-02-08 DIAGNOSIS — E039 Hypothyroidism, unspecified: Secondary | ICD-10-CM | POA: Diagnosis not present

## 2022-02-08 DIAGNOSIS — E7800 Pure hypercholesterolemia, unspecified: Secondary | ICD-10-CM | POA: Diagnosis not present

## 2022-02-08 DIAGNOSIS — M810 Age-related osteoporosis without current pathological fracture: Secondary | ICD-10-CM | POA: Diagnosis not present

## 2022-02-08 DIAGNOSIS — Z0000 Encounter for general adult medical examination without abnormal findings: Secondary | ICD-10-CM | POA: Diagnosis not present

## 2022-02-08 LAB — COMPREHENSIVE METABOLIC PANEL: EGFR (Non-African Amer.): 84.2

## 2022-02-15 DIAGNOSIS — R82998 Other abnormal findings in urine: Secondary | ICD-10-CM | POA: Diagnosis not present

## 2022-02-15 DIAGNOSIS — Z1331 Encounter for screening for depression: Secondary | ICD-10-CM | POA: Diagnosis not present

## 2022-02-15 DIAGNOSIS — Z0000 Encounter for general adult medical examination without abnormal findings: Secondary | ICD-10-CM | POA: Diagnosis not present

## 2022-02-15 DIAGNOSIS — E039 Hypothyroidism, unspecified: Secondary | ICD-10-CM | POA: Diagnosis not present

## 2022-02-15 DIAGNOSIS — Z1339 Encounter for screening examination for other mental health and behavioral disorders: Secondary | ICD-10-CM | POA: Diagnosis not present

## 2022-03-06 DIAGNOSIS — M4316 Spondylolisthesis, lumbar region: Secondary | ICD-10-CM | POA: Diagnosis not present

## 2022-03-06 DIAGNOSIS — M2170 Unequal limb length (acquired), unspecified site: Secondary | ICD-10-CM | POA: Diagnosis not present

## 2022-03-06 DIAGNOSIS — M5442 Lumbago with sciatica, left side: Secondary | ICD-10-CM | POA: Diagnosis not present

## 2022-03-06 DIAGNOSIS — M4126 Other idiopathic scoliosis, lumbar region: Secondary | ICD-10-CM | POA: Diagnosis not present

## 2022-03-07 ENCOUNTER — Other Ambulatory Visit: Payer: Self-pay | Admitting: Neurological Surgery

## 2022-03-07 DIAGNOSIS — G8929 Other chronic pain: Secondary | ICD-10-CM

## 2022-03-16 ENCOUNTER — Ambulatory Visit
Admission: RE | Admit: 2022-03-16 | Discharge: 2022-03-16 | Disposition: A | Discharge status: Home / Self Care | Payer: BC Managed Care – PPO | Source: Ambulatory Visit | Attending: Neurological Surgery | Admitting: Neurological Surgery

## 2022-03-16 DIAGNOSIS — M4727 Other spondylosis with radiculopathy, lumbosacral region: Secondary | ICD-10-CM | POA: Diagnosis not present

## 2022-03-16 DIAGNOSIS — M48061 Spinal stenosis, lumbar region without neurogenic claudication: Secondary | ICD-10-CM | POA: Diagnosis not present

## 2022-03-16 DIAGNOSIS — G8929 Other chronic pain: Secondary | ICD-10-CM

## 2022-03-16 MED ORDER — IOPAMIDOL (ISOVUE-M 200) INJECTION 41%
1.0000 mL | Freq: Once | INTRAMUSCULAR | Status: AC
  Administered 2022-03-16: 1 mL via EPIDURAL

## 2022-03-16 MED ORDER — METHYLPREDNISOLONE ACETATE 40 MG/ML INJ SUSP (RADIOLOG
80.0000 mg | Freq: Once | INTRAMUSCULAR | Status: AC
  Administered 2022-03-16: 80 mg via EPIDURAL

## 2022-03-16 NOTE — Discharge Instructions (Signed)

## 2022-03-24 ENCOUNTER — Telehealth: Payer: Self-pay | Admitting: *Deleted

## 2022-03-24 NOTE — Telephone Encounter (Addendum)
Deductible $3000 Met of $58 Required  OOP MAX  $7500 ($58 Met)    Annual exam  12/27/2021   Calcium 10.0             Date 09/22/2021   Upcoming dental procedures no  Prior Authorization needed approved on file 04/06/2022-04/05/2023  Pt estimated Cost $2100       Coverage Details: I got information from rep this plan started 03/19/2022

## 2022-05-08 ENCOUNTER — Ambulatory Visit: Payer: BC Managed Care – PPO

## 2022-05-08 DIAGNOSIS — M4126 Other idiopathic scoliosis, lumbar region: Secondary | ICD-10-CM | POA: Diagnosis not present

## 2022-05-08 DIAGNOSIS — M2170 Unequal limb length (acquired), unspecified site: Secondary | ICD-10-CM | POA: Diagnosis not present

## 2022-05-08 DIAGNOSIS — M810 Age-related osteoporosis without current pathological fracture: Secondary | ICD-10-CM | POA: Diagnosis not present

## 2022-05-08 MED ORDER — DENOSUMAB 60 MG/ML ~~LOC~~ SOSY
60.0000 mg | PREFILLED_SYRINGE | Freq: Once | SUBCUTANEOUS | Status: AC
  Administered 2022-05-08: 60 mg via SUBCUTANEOUS

## 2022-05-08 NOTE — Progress Notes (Signed)
Patient in today for Prolia injection. Patient's initial calcium level was obtained on 09/22/21.  Result: 10.0.  Last AEX: 12/27/21 Last BMD: 06/08/21  Injection given in left arm.  Patient tolerated injection well.  Routed to provider for review.

## 2022-05-10 ENCOUNTER — Other Ambulatory Visit: Payer: Self-pay | Admitting: Neurological Surgery

## 2022-05-10 DIAGNOSIS — M4126 Other idiopathic scoliosis, lumbar region: Secondary | ICD-10-CM

## 2022-05-26 NOTE — Telephone Encounter (Signed)
Appt 05/08/2022

## 2022-05-30 ENCOUNTER — Other Ambulatory Visit: Payer: BC Managed Care – PPO

## 2022-06-01 ENCOUNTER — Ambulatory Visit
Admission: RE | Admit: 2022-06-01 | Discharge: 2022-06-01 | Disposition: A | Discharge status: Home / Self Care | Payer: BC Managed Care – PPO | Source: Ambulatory Visit | Attending: Neurological Surgery | Admitting: Neurological Surgery

## 2022-06-01 DIAGNOSIS — M5116 Intervertebral disc disorders with radiculopathy, lumbar region: Secondary | ICD-10-CM | POA: Diagnosis not present

## 2022-06-01 DIAGNOSIS — M47817 Spondylosis without myelopathy or radiculopathy, lumbosacral region: Secondary | ICD-10-CM | POA: Diagnosis not present

## 2022-06-01 DIAGNOSIS — M48061 Spinal stenosis, lumbar region without neurogenic claudication: Secondary | ICD-10-CM | POA: Diagnosis not present

## 2022-06-01 DIAGNOSIS — M4126 Other idiopathic scoliosis, lumbar region: Secondary | ICD-10-CM

## 2022-06-01 MED ORDER — IOPAMIDOL (ISOVUE-M 200) INJECTION 41%
1.0000 mL | Freq: Once | INTRAMUSCULAR | Status: AC
  Administered 2022-06-01: 1 mL via EPIDURAL

## 2022-06-01 MED ORDER — METHYLPREDNISOLONE ACETATE 40 MG/ML INJ SUSP (RADIOLOG
80.0000 mg | Freq: Once | INTRAMUSCULAR | Status: AC
  Administered 2022-06-01: 80 mg via EPIDURAL

## 2022-06-01 NOTE — Discharge Instructions (Signed)

## 2022-07-18 DIAGNOSIS — M4185 Other forms of scoliosis, thoracolumbar region: Secondary | ICD-10-CM | POA: Diagnosis not present

## 2022-07-24 NOTE — Progress Notes (Unsigned)
Cardiology Office Note   Date:  07/25/2022   ID:  Alicia Cordova, DOB May 20, 1946, MRN [id number]  PCP:  Alicia Bowen, MD  Cardiologist:   Alicia Carnes, MD   Pt presents for follow up of cardiac risk factors    History of Present Illness: Alicia Cordova is a 77 y.o. female with a FHx of CAD (Father 12s; mom CHF 98s) Her husband died suddenly of an MI   I saw the pt in clinic in 2021 Since seen she has been doing good   Denies CP  Breathing is good   She exercise regularly (pilates spin,weights)    [place]   Current Meds  Medication Sig   BIOTIN 5000 PO Take 5,000 mcg by mouth daily.   calcium carbonate (OS-CAL) 600 MG TABS tablet Take 600 mg by mouth 2 (two) times daily with a meal.   cholecalciferol (VITAMIN D) 1000 units tablet Take 1,000 Units by mouth daily.   Cranberry 1000 MG CAPS Take by mouth.   denosumab (PROLIA) 60 MG/ML SOSY injection Inject 60 mg into the skin every 6 (six) months.   gabapentin (NEURONTIN) 300 MG capsule Take 300 mg by mouth as directed. UP TO 3 A DAY   glucosamine-chondroitin 500-400 MG tablet Take 1 tablet by mouth daily.    levothyroxine (SYNTHROID, LEVOTHROID) 75 MCG tablet Take 75 mcg by mouth daily before breakfast.   Multiple Vitamin (MULTIVITAMIN) tablet Take 1 tablet by mouth daily.   timolol (TIMOPTIC) 0.5 % ophthalmic solution Place 1 drop into both eyes 2 (two) times daily.    triamterene-hydrochlorothiazide (DYAZIDE) 37.5-25 MG capsule Take 1 capsule by mouth every other day.   TURMERIC PO Take by mouth.     Allergies:   Lisinopril   Past Medical History:  Diagnosis Date   Cataract    CHEST PAIN-UNSPECIFIED 05/26/2009   Qualifier: Diagnosis of  By: Mare Ferrari, RMA, Sherri     Glaucoma    Hormone disorder    Hx of adenomatous polyp of colon 04/2018   no recall due to age   Hypertension    HYPOTHYROIDISM 05/26/2009   Qualifier: Diagnosis of  By: Ron Parker, MD, Caffie Damme    Osteoporosis     Spinal stenosis     Past Surgical History:  Procedure Laterality Date   arm surgery     metal plate in left arm   CATARACT EXTRACTION, BILATERAL  2020   COLONOSCOPY  2009   repeated 2019 - diminutive adenoma - no recall (age)   TUBAL LIGATION     TYMPANOSTOMY TUBE PLACEMENT       Social History:  The patient  reports that she has never smoked. She has never used smokeless tobacco. She reports current alcohol use. She reports that she does not use drugs.   Family History:  The patient's family history includes Colon cancer in her maternal aunt; Congestive Heart Failure in her mother; Melanoma in her mother; Stroke in her mother.    ROS:  Please see the history of present illness. All other systems are reviewed and  Negative to the above problem except as noted.    PHYSICAL EXAM: VS:  BP 128/74   Pulse (!) 51   Ht '5\' 2"'$  (1.575 m)   Wt 139 lb (63 kg)   SpO2 99%   BMI 25.42 kg/m   GEN: Well nourished, well developed, in no acute distress  HEENT: normal  Neck: no JVD,  carotid bruits Cardiac: RRR; no murmurs   No LE edema  Respiratory:  clear to auscultation bilaterally GI: soft, nontender, nondistended, + BS  No hepatomegaly  MS: no deformity Moving all extremities   Skin: warm and dry, no rash Neuro:  Strength and sensation are intact Psych: euthymic mood, full affect   EKG:  EKG is ordered today.  SB   51 bpm     Lipid Panel No results found for: "CHOL", "TRIG", "HDL", "CHOLHDL", "VLDL", "LDLCALC", "LDLDIRECT"    Wt Readings from Last 3 Encounters:  07/25/22 139 lb (63 kg)  12/27/21 136 lb (61.7 kg)  12/15/20 135 lb (61.2 kg)      ASSESSMENT AND PLAN:  1 Hx CP  pt denies   2   BP   BP is a controlled   Follow   3  LIpids    Will get labs from Dr Forde Dandy    Discussed possible Ca score    Will reflect on after review of labs   Stay active    F/U in 2 years    Sooner if symptoms develop.  Current medicines are reviewed at length with the patient today.  The  patient does not have concerns regarding medicines.  Signed, Alicia Carnes, MD  07/25/2022 9:48 PM    [facility] [place], [place], [place]  [address] Phone: [phone]; Fax: [phone]

## 2022-07-25 ENCOUNTER — Encounter: Payer: Self-pay | Admitting: Internal Medicine

## 2022-07-25 ENCOUNTER — Ambulatory Visit: Payer: BC Managed Care – PPO | Attending: Internal Medicine | Admitting: Internal Medicine

## 2022-07-25 VITALS — BP 128/74 | HR 51 | Ht 62.0 in | Wt 139.0 lb

## 2022-07-25 DIAGNOSIS — R079 Chest pain, unspecified: Secondary | ICD-10-CM

## 2022-07-25 NOTE — Patient Instructions (Signed)
Medication Instructions:   *If you need a refill on your cardiac medications before your next appointment, please call your pharmacy*   Lab Work:  If you have labs (blood work) drawn today and your tests are completely normal, you will receive your results only by: [place] (if you have MyChart) OR A paper copy in the mail If you have any lab test that is abnormal or we need to change your treatment, we will call you to review the results.   Testing/Procedures:    Follow-Up: At [facility], you and your health needs are our priority.  As part of our continuing mission to provide you with exceptional heart care, we have created designated Provider Care Teams.  These Care Teams include your primary Cardiologist (physician) and Advanced Practice Providers (APPs -  Physician Assistants and Nurse Practitioners) who all work together to provide you with the care you need, when you need it.  We recommend signing up for the patient portal called "MyChart".  Sign up information is provided on this After Visit Summary.  MyChart is used to connect with patients for Virtual Visits (Telemedicine).  Patients are able to view lab/test results, encounter notes, upcoming appointments, etc.  Non-urgent messages can be sent to your provider as well.   To learn more about what you can do with MyChart, go to [email or web address].    Your next appointment:   2 year(s)  Provider:   Dorris Carnes, MD     Other Instructions

## 2022-08-03 DIAGNOSIS — Z1231 Encounter for screening mammogram for malignant neoplasm of breast: Secondary | ICD-10-CM | POA: Diagnosis not present

## 2022-09-13 DIAGNOSIS — H401131 Primary open-angle glaucoma, bilateral, mild stage: Secondary | ICD-10-CM | POA: Diagnosis not present

## 2022-09-13 DIAGNOSIS — Z961 Presence of intraocular lens: Secondary | ICD-10-CM | POA: Diagnosis not present

## 2022-10-16 ENCOUNTER — Telehealth: Payer: Self-pay | Admitting: *Deleted

## 2022-10-16 DIAGNOSIS — M810 Age-related osteoporosis without current pathological fracture: Secondary | ICD-10-CM

## 2022-10-16 NOTE — Telephone Encounter (Signed)
Insurance submitted to [employer] for benefit verification of Prolia. Will await response.  

## 2022-11-03 DIAGNOSIS — H90A21 Sensorineural hearing loss, unilateral, right ear, with restricted hearing on the contralateral side: Secondary | ICD-10-CM | POA: Diagnosis not present

## 2022-11-03 DIAGNOSIS — H8103 Meniere's disease, bilateral: Secondary | ICD-10-CM | POA: Diagnosis not present

## 2022-11-03 DIAGNOSIS — Z9889 Other specified postprocedural states: Secondary | ICD-10-CM | POA: Diagnosis not present

## 2022-11-03 DIAGNOSIS — Z0110 Encounter for examination of ears and hearing without abnormal findings: Secondary | ICD-10-CM | POA: Diagnosis not present

## 2022-11-03 DIAGNOSIS — H903 Sensorineural hearing loss, bilateral: Secondary | ICD-10-CM | POA: Diagnosis not present

## 2022-11-07 MED ORDER — DENOSUMAB 60 MG/ML ~~LOC~~ SOSY
60.0000 mg | PREFILLED_SYRINGE | Freq: Once | SUBCUTANEOUS | Status: AC
  Administered 2022-11-21: 60 mg via SUBCUTANEOUS

## 2022-11-07 NOTE — Telephone Encounter (Addendum)
Deductible:  No   OOP MAX: No   Annual exam: 12-27-21 ML  Calcium:    9.1       Date: 02-08-22 at Dr. Rinaldo Cloud office-- will have medical records scan into Epic.   Upcoming dental procedures: No   Hx of Kidney Disease: No   Last Bone Density Scan: 06-08-21   Is Prior Authorization needed: yes, on file through 04-05-23. Reference #: [id number].  Pt estimated Cost: $0    Coverage Details:Prolia will be covered at 100%. Administration will be covered at 100% after the OOP max is met. No deductible or coinsurance applies.

## 2022-11-07 NOTE — Telephone Encounter (Signed)
Call to patient. Advised lab results received from PCP and calcium level normal on 02-08-22 okay to proceed with prolia. Patient agreeable. Patient scheduled for 11-21-22 at 0815. Agreeable to date and time of appointment.   Summary of benefits scanned into Epic. Encounter closed.

## 2022-11-21 ENCOUNTER — Ambulatory Visit (INDEPENDENT_AMBULATORY_CARE_PROVIDER_SITE_OTHER): Payer: BC Managed Care – PPO

## 2022-11-21 DIAGNOSIS — M810 Age-related osteoporosis without current pathological fracture: Secondary | ICD-10-CM

## 2023-01-02 ENCOUNTER — Encounter: Payer: Self-pay | Admitting: Obstetrics & Gynecology

## 2023-01-02 ENCOUNTER — Other Ambulatory Visit: Payer: Self-pay

## 2023-01-02 ENCOUNTER — Ambulatory Visit (INDEPENDENT_AMBULATORY_CARE_PROVIDER_SITE_OTHER): Payer: BC Managed Care – PPO | Admitting: Obstetrics & Gynecology

## 2023-01-02 VITALS — BP 110/72 | HR 56 | Ht 60.75 in | Wt 140.0 lb

## 2023-01-02 DIAGNOSIS — Z01419 Encounter for gynecological examination (general) (routine) without abnormal findings: Secondary | ICD-10-CM

## 2023-01-02 DIAGNOSIS — Z780 Asymptomatic menopausal state: Secondary | ICD-10-CM

## 2023-01-02 DIAGNOSIS — M810 Age-related osteoporosis without current pathological fracture: Secondary | ICD-10-CM

## 2023-01-02 NOTE — Progress Notes (Signed)
Alicia Cordova 10-15-1945 [id number]   History:    77 y.o.  G2P2L2 Widowed.  [employer] retreat in the [place].   RP:  Established patient presenting for annual gyn exam   HPI: Postmenopause, well on no hormone replacement therapy.  No postmenopausal bleeding.  No pelvic pain.  Abstinent. Pap 09/2016 Neg.  No h/o abnormal Pap.  No indication to repeat a Pap at this time. Urine and bowel movements normal.  Breasts normal.  Mammo Neg 06/2022 at [facility]. Body mass index 26.67.  Physically active.  Health labs with family physician. Osteoporosis improved on Prolia per last BD 05/2021 Lt Femoral Neck -2.3.  Repeat BD at [facility] in 05/2023.  Taking vitamin D supplements and calcium intake of 1200 mg daily. Colono 04/2018. Investigated and under treatment for Spinal Stenosis.   Past medical history,surgical history, family history and social history were all reviewed and documented in the EPIC chart.  Gynecologic History No LMP recorded. Patient is postmenopausal.  Obstetric History OB History  Gravida Para Term Preterm AB Living  2 2 2     2   SAB IAB Ectopic Multiple Live Births               # Outcome Date GA Lbr Len/2nd Weight Sex Type Anes PTL Lv  2 Term           1 Term              ROS: A ROS was performed and pertinent positives and negatives are included in the history. GENERAL: No fevers or chills. HEENT: No change in vision, no earache, sore throat or sinus congestion. NECK: No pain or stiffness. CARDIOVASCULAR: No chest pain or pressure. No palpitations. PULMONARY: No shortness of breath, cough or wheeze. GASTROINTESTINAL: No abdominal pain, nausea, vomiting or diarrhea, melena or bright red blood per rectum. GENITOURINARY: No urinary frequency, urgency, hesitancy or dysuria. MUSCULOSKELETAL: No joint or muscle pain, no back pain, no recent trauma. DERMATOLOGIC: No rash, no itching, no lesions. ENDOCRINE: No polyuria, polydipsia, no heat or cold intolerance. No recent change in weight.  HEMATOLOGICAL: No anemia or easy bruising or bleeding. NEUROLOGIC: No headache, seizures, numbness, tingling or weakness. PSYCHIATRIC: No depression, no loss of interest in normal activity or change in sleep pattern.     Exam:   BP 110/72   Pulse (!) 56   Ht 5' 0.75" (1.543 m)   Wt 140 lb (63.5 kg)   SpO2 97%   BMI 26.67 kg/m   Body mass index is 26.67 kg/m.  General appearance : Well developed well nourished female. No acute distress HEENT: Eyes: no retinal hemorrhage or exudates,  Neck supple, trachea midline, no carotid bruits, no thyroidmegaly Lungs: Clear to auscultation, no rhonchi or wheezes, or rib retractions  Heart: Regular rate and rhythm, no murmurs or gallops Breast:Examined in sitting and supine position were symmetrical in appearance, no palpable masses or tenderness,  no skin retraction, no nipple inversion, no nipple discharge, no skin discoloration, no axillary or supraclavicular lymphadenopathy Abdomen: no palpable masses or tenderness, no rebound or guarding Extremities: no edema or skin discoloration or tenderness  Pelvic: Vulva: Mild white atrophy             Vagina: No gross lesions or discharge  Cervix: No gross lesions or discharge  Uterus  AV, normal size, shape and consistency, non-tender and mobile  Adnexa  Without masses or tenderness  Anus: Normal, except for an external non-thrombosed hemorrhoid.   Assessment/Plan:  77 y.o. female for annual exam   1. Well female exam with routine gynecological exam Postmenopause, well on no hormone replacement therapy.  No postmenopausal bleeding.  No pelvic pain.  Abstinent. Pap 09/2016 Neg.  No h/o abnormal Pap.  No indication to repeat a Pap at this time. Urine and bowel movements normal.  Breasts normal.  Mammo Neg 06/2022 at [facility]. Body mass index 26.67.  Physically active.  Health labs with family physician. Osteoporosis improved on Prolia per last BD 05/2021 Lt Femoral Neck -2.3.  Repeat BD at [facility] in 05/2023.   Taking vitamin D supplements and calcium intake of 1200 mg daily. Colono 04/2018. Investigated and under treatment for Spinal Stenosis.  2. Postmenopause Postmenopause, well on no hormone replacement therapy.  No postmenopausal bleeding.  No pelvic pain.  Abstinent.   3. Age-related osteoporosis without current pathological fracture  Body mass index 26.67.  Physically active. Osteoporosis improved on Prolia per last BD 05/2021 Lt Femoral Neck -2.3.  Repeat BD at [facility] in 05/2023.  Taking vitamin D supplements and calcium intake of 1200 mg daily.   Genia Del MD, 8:12 AM

## 2023-02-14 DIAGNOSIS — M5416 Radiculopathy, lumbar region: Secondary | ICD-10-CM | POA: Diagnosis not present

## 2023-02-14 DIAGNOSIS — M4316 Spondylolisthesis, lumbar region: Secondary | ICD-10-CM | POA: Diagnosis not present

## 2023-02-21 DIAGNOSIS — M810 Age-related osteoporosis without current pathological fracture: Secondary | ICD-10-CM | POA: Diagnosis not present

## 2023-02-21 DIAGNOSIS — E785 Hyperlipidemia, unspecified: Secondary | ICD-10-CM | POA: Diagnosis not present

## 2023-02-21 DIAGNOSIS — E039 Hypothyroidism, unspecified: Secondary | ICD-10-CM | POA: Diagnosis not present

## 2023-02-21 LAB — LAB REPORT - SCANNED: EGFR (Non-African Amer.): 69.4

## 2023-02-28 DIAGNOSIS — Z0000 Encounter for general adult medical examination without abnormal findings: Secondary | ICD-10-CM | POA: Diagnosis not present

## 2023-02-28 DIAGNOSIS — Z1331 Encounter for screening for depression: Secondary | ICD-10-CM | POA: Diagnosis not present

## 2023-02-28 DIAGNOSIS — Z1339 Encounter for screening examination for other mental health and behavioral disorders: Secondary | ICD-10-CM | POA: Diagnosis not present

## 2023-02-28 DIAGNOSIS — Z1212 Encounter for screening for malignant neoplasm of rectum: Secondary | ICD-10-CM | POA: Diagnosis not present

## 2023-02-28 DIAGNOSIS — R82998 Other abnormal findings in urine: Secondary | ICD-10-CM | POA: Diagnosis not present

## 2023-02-28 DIAGNOSIS — E039 Hypothyroidism, unspecified: Secondary | ICD-10-CM | POA: Diagnosis not present

## 2023-03-12 DIAGNOSIS — M5416 Radiculopathy, lumbar region: Secondary | ICD-10-CM | POA: Diagnosis not present

## 2023-03-27 DIAGNOSIS — H401131 Primary open-angle glaucoma, bilateral, mild stage: Secondary | ICD-10-CM | POA: Diagnosis not present

## 2023-05-01 ENCOUNTER — Telehealth: Payer: Self-pay | Admitting: *Deleted

## 2023-05-01 NOTE — Telephone Encounter (Signed)
Insurance information submitted to [employer] portal. Will await summary of benefits for prolia.    

## 2023-05-03 NOTE — Telephone Encounter (Signed)
[employer] PA for prolia filled out and taken to [place], NP's desk to review and sign. Will fax once signed.

## 2023-05-08 NOTE — Telephone Encounter (Signed)
PA faxed to [facility] at [phone]. Will await response.

## 2023-05-23 NOTE — Telephone Encounter (Signed)
PA approved 05/08/23 to 05/07/24, reference #: [id number].   Message left to return call to [place] at [phone].

## 2023-05-24 ENCOUNTER — Ambulatory Visit: Payer: BC Managed Care – PPO

## 2023-05-24 DIAGNOSIS — M810 Age-related osteoporosis without current pathological fracture: Secondary | ICD-10-CM | POA: Diagnosis not present

## 2023-05-24 MED ORDER — DENOSUMAB 60 MG/ML ~~LOC~~ SOSY
60.0000 mg | PREFILLED_SYRINGE | Freq: Once | SUBCUTANEOUS | Status: AC
  Administered 2023-05-24: 60 mg via SUBCUTANEOUS

## 2023-05-24 NOTE — Progress Notes (Signed)
Patient is in office today for a nurse visit for  Prolia Injection . Patient Injection was given in the  Left arm. Patient tolerated injection well.

## 2023-07-04 NOTE — Telephone Encounter (Signed)
 Patient received prolia  on 05/24/23. Encounter closed.

## 2023-07-10 DIAGNOSIS — M4126 Other idiopathic scoliosis, lumbar region: Secondary | ICD-10-CM | POA: Diagnosis not present

## 2023-07-10 DIAGNOSIS — M4316 Spondylolisthesis, lumbar region: Secondary | ICD-10-CM | POA: Diagnosis not present

## 2023-07-10 DIAGNOSIS — M5416 Radiculopathy, lumbar region: Secondary | ICD-10-CM | POA: Diagnosis not present

## 2023-07-16 ENCOUNTER — Other Ambulatory Visit: Payer: Self-pay | Admitting: Neurosurgery

## 2023-07-16 DIAGNOSIS — M5416 Radiculopathy, lumbar region: Secondary | ICD-10-CM

## 2023-07-28 ENCOUNTER — Other Ambulatory Visit: Payer: BC Managed Care – PPO

## 2023-07-29 ENCOUNTER — Other Ambulatory Visit: Payer: BC Managed Care – PPO

## 2023-08-08 DIAGNOSIS — M5416 Radiculopathy, lumbar region: Secondary | ICD-10-CM | POA: Diagnosis not present

## 2023-08-30 DIAGNOSIS — M8588 Other specified disorders of bone density and structure, other site: Secondary | ICD-10-CM | POA: Diagnosis not present

## 2023-08-30 DIAGNOSIS — Z1231 Encounter for screening mammogram for malignant neoplasm of breast: Secondary | ICD-10-CM | POA: Diagnosis not present

## 2023-08-30 DIAGNOSIS — M810 Age-related osteoporosis without current pathological fracture: Secondary | ICD-10-CM | POA: Diagnosis not present

## 2023-08-30 DIAGNOSIS — Z8262 Family history of osteoporosis: Secondary | ICD-10-CM | POA: Diagnosis not present

## 2023-09-25 DIAGNOSIS — H401131 Primary open-angle glaucoma, bilateral, mild stage: Secondary | ICD-10-CM | POA: Diagnosis not present

## 2023-09-25 DIAGNOSIS — H26493 Other secondary cataract, bilateral: Secondary | ICD-10-CM | POA: Diagnosis not present

## 2023-09-25 DIAGNOSIS — Z961 Presence of intraocular lens: Secondary | ICD-10-CM | POA: Diagnosis not present

## 2023-10-31 ENCOUNTER — Other Ambulatory Visit: Payer: Self-pay | Admitting: *Deleted

## 2023-10-31 ENCOUNTER — Encounter: Payer: Self-pay | Admitting: *Deleted

## 2023-10-31 DIAGNOSIS — M810 Age-related osteoporosis without current pathological fracture: Secondary | ICD-10-CM

## 2023-10-31 MED ORDER — DENOSUMAB 60 MG/ML ~~LOC~~ SOSY
60.0000 mg | PREFILLED_SYRINGE | SUBCUTANEOUS | Status: AC
  Administered 2023-11-29: 60 mg via SUBCUTANEOUS

## 2023-11-06 DIAGNOSIS — M5416 Radiculopathy, lumbar region: Secondary | ICD-10-CM | POA: Diagnosis not present

## 2023-11-16 DIAGNOSIS — Z974 Presence of external hearing-aid: Secondary | ICD-10-CM | POA: Diagnosis not present

## 2023-11-16 DIAGNOSIS — H918X3 Other specified hearing loss, bilateral: Secondary | ICD-10-CM | POA: Diagnosis not present

## 2023-11-16 DIAGNOSIS — Z461 Encounter for fitting and adjustment of hearing aid: Secondary | ICD-10-CM | POA: Diagnosis not present

## 2023-11-16 DIAGNOSIS — H903 Sensorineural hearing loss, bilateral: Secondary | ICD-10-CM | POA: Diagnosis not present

## 2023-11-16 DIAGNOSIS — Z9889 Other specified postprocedural states: Secondary | ICD-10-CM | POA: Diagnosis not present

## 2023-11-16 DIAGNOSIS — H8103 Meniere's disease, bilateral: Secondary | ICD-10-CM | POA: Diagnosis not present

## 2023-11-16 DIAGNOSIS — H90A21 Sensorineural hearing loss, unilateral, right ear, with restricted hearing on the contralateral side: Secondary | ICD-10-CM | POA: Diagnosis not present

## 2023-11-20 DIAGNOSIS — M5416 Radiculopathy, lumbar region: Secondary | ICD-10-CM | POA: Diagnosis not present

## 2023-11-29 ENCOUNTER — Ambulatory Visit (INDEPENDENT_AMBULATORY_CARE_PROVIDER_SITE_OTHER)

## 2023-11-29 DIAGNOSIS — M810 Age-related osteoporosis without current pathological fracture: Secondary | ICD-10-CM

## 2023-12-13 DIAGNOSIS — M5416 Radiculopathy, lumbar region: Secondary | ICD-10-CM | POA: Diagnosis not present

## 2024-01-07 DIAGNOSIS — M5416 Radiculopathy, lumbar region: Secondary | ICD-10-CM | POA: Diagnosis not present

## 2024-01-14 DIAGNOSIS — M545 Low back pain, unspecified: Secondary | ICD-10-CM | POA: Diagnosis not present

## 2024-01-25 DIAGNOSIS — M48062 Spinal stenosis, lumbar region with neurogenic claudication: Secondary | ICD-10-CM | POA: Diagnosis not present

## 2024-01-28 ENCOUNTER — Other Ambulatory Visit: Payer: Self-pay | Admitting: Physical Medicine and Rehabilitation

## 2024-01-28 DIAGNOSIS — M48062 Spinal stenosis, lumbar region with neurogenic claudication: Secondary | ICD-10-CM

## 2024-02-19 ENCOUNTER — Other Ambulatory Visit

## 2024-02-27 DIAGNOSIS — E039 Hypothyroidism, unspecified: Secondary | ICD-10-CM | POA: Diagnosis not present

## 2024-02-27 DIAGNOSIS — E785 Hyperlipidemia, unspecified: Secondary | ICD-10-CM | POA: Diagnosis not present

## 2024-02-27 DIAGNOSIS — E559 Vitamin D deficiency, unspecified: Secondary | ICD-10-CM | POA: Diagnosis not present

## 2024-02-27 LAB — LAB REPORT - SCANNED
Free T4: 1.48 ng/dL
TSH: 4.17

## 2024-02-27 LAB — COMPREHENSIVE METABOLIC PANEL WITH GFR: EGFR (Non-African Amer.): 69.2

## 2024-03-05 DIAGNOSIS — Z1339 Encounter for screening examination for other mental health and behavioral disorders: Secondary | ICD-10-CM | POA: Diagnosis not present

## 2024-03-05 DIAGNOSIS — Z1331 Encounter for screening for depression: Secondary | ICD-10-CM | POA: Diagnosis not present

## 2024-03-05 DIAGNOSIS — Z23 Encounter for immunization: Secondary | ICD-10-CM | POA: Diagnosis not present

## 2024-03-05 DIAGNOSIS — E039 Hypothyroidism, unspecified: Secondary | ICD-10-CM | POA: Diagnosis not present

## 2024-03-05 DIAGNOSIS — Z1212 Encounter for screening for malignant neoplasm of rectum: Secondary | ICD-10-CM | POA: Diagnosis not present

## 2024-03-05 DIAGNOSIS — Z0000 Encounter for general adult medical examination without abnormal findings: Secondary | ICD-10-CM | POA: Diagnosis not present

## 2024-03-26 DIAGNOSIS — H401112 Primary open-angle glaucoma, right eye, moderate stage: Secondary | ICD-10-CM | POA: Diagnosis not present

## 2024-03-26 DIAGNOSIS — H401121 Primary open-angle glaucoma, left eye, mild stage: Secondary | ICD-10-CM | POA: Diagnosis not present

## 2024-03-27 DIAGNOSIS — M48061 Spinal stenosis, lumbar region without neurogenic claudication: Secondary | ICD-10-CM | POA: Diagnosis not present

## 2024-03-27 DIAGNOSIS — M5116 Intervertebral disc disorders with radiculopathy, lumbar region: Secondary | ICD-10-CM | POA: Diagnosis not present

## 2024-03-27 DIAGNOSIS — M4316 Spondylolisthesis, lumbar region: Secondary | ICD-10-CM | POA: Diagnosis not present

## 2024-04-04 DIAGNOSIS — R82998 Other abnormal findings in urine: Secondary | ICD-10-CM | POA: Diagnosis not present

## 2024-05-09 DIAGNOSIS — M48062 Spinal stenosis, lumbar region with neurogenic claudication: Secondary | ICD-10-CM | POA: Diagnosis not present

## 2024-05-13 ENCOUNTER — Telehealth: Payer: Self-pay

## 2024-05-13 ENCOUNTER — Other Ambulatory Visit: Payer: Self-pay | Admitting: *Deleted

## 2024-05-13 DIAGNOSIS — M810 Age-related osteoporosis without current pathological fracture: Secondary | ICD-10-CM

## 2024-05-13 MED ORDER — DENOSUMAB 60 MG/ML ~~LOC~~ SOSY
60.0000 mg | PREFILLED_SYRINGE | SUBCUTANEOUS | Status: AC
  Administered 2024-06-04: 09:00:00 60 mg via SUBCUTANEOUS

## 2024-05-13 NOTE — Telephone Encounter (Signed)
 Prolia  VOB initiated via [email or web address]  Next Prolia  inj DUE: 05/30/24

## 2024-05-14 NOTE — Telephone Encounter (Signed)
 Patient called about scheduling prolia  injection

## 2024-05-19 NOTE — Telephone Encounter (Signed)
 Additional information has been requested from the patient's insurance in order to proceed with the prior authorization request. Requested information has been sent, or form has been filled out and faxed back to [phone]

## 2024-05-19 NOTE — Telephone Encounter (Signed)
 PHARMACY PA SUBMITTED VIA LATENT. KEY: [id number]   MEDICAL PA SUBMITTED VIA BLUE E.

## 2024-05-19 NOTE — Telephone Encounter (Signed)
 SABRA

## 2024-05-20 ENCOUNTER — Other Ambulatory Visit (HOSPITAL_COMMUNITY): Payer: Self-pay

## 2024-05-20 NOTE — Telephone Encounter (Signed)
 Buy/Bill (Office supplied medication)  Out-of-pocket cost due at time of clinic visit: $357  Number of injection/visits approved: 2  Primary: [employer] Co-insurance: 20% Admin fee co-insurance: 20%  Secondary: --- Co-insurance:  Admin fee co-insurance:   Medical Benefit Details: Date Benefits were checked: 05/19/24 Deductible: $302.67 Met of $3500 Required/ Coinsurance: 20%/ Admin Fee: 20%  Prior Auth: APPROVED PA# [id number] Expiration Date: 05/19/24-06/18/24  # of doses approved: 2 -----------------------------------------------------------------------  Patient IS eligible for Copay Card. Copay Card can make patient's cost as little as $25. Link to apply: [email or web address]  ** This summary of benefits is an estimation of the patient's out-of-pocket cost. Exact cost may very based on individual plan coverage.

## 2024-05-20 NOTE — Telephone Encounter (Addendum)
 Alicia Cordova

## 2024-05-21 NOTE — Telephone Encounter (Signed)
 See Prolia referral.

## 2024-05-29 DIAGNOSIS — M48062 Spinal stenosis, lumbar region with neurogenic claudication: Secondary | ICD-10-CM | POA: Diagnosis not present

## 2024-06-04 ENCOUNTER — Ambulatory Visit

## 2024-06-04 DIAGNOSIS — M810 Age-related osteoporosis without current pathological fracture: Secondary | ICD-10-CM | POA: Diagnosis not present

## 2024-06-04 NOTE — Progress Notes (Signed)
 Patient in today for tenth Prolia  injection. Patient's initial calcium level was obtained on 06/04/23.  Result: 9.5.  Last AEX: 01/02/23 Last BMD: 08/30/23  Injection given in left arm.  Patient tolerated injection well.  Routed to provider for review.

## 2024-06-06 ENCOUNTER — Ambulatory Visit: Payer: Self-pay | Admitting: Obstetrics and Gynecology

## 2024-06-18 NOTE — Telephone Encounter (Signed)
" ° °  [employer] PREFERRED. APPROVAL LETTER ATTACHED TO CHART "

## 2024-07-06 NOTE — Progress Notes (Unsigned)
 "   Cardiology Office Note   Date:  07/07/2024   ID:  Alicia Cordova, DOB [birth year], MRN [id number]  PCP:  Nichole Senior, MD  Cardiologist:   Vina Gull, MD   Pt presents again for follow up of cardiac risk factors    History of Present Illness: Alicia Cordova is a 79 y.o. female with a FHx of CAD (Father 52s; mom CHF 14s) Her husband died suddenly of an MI    I saw the pt in clinic in Feb 2024  Since seen she has done well  Breathing is good   Active   NO CP with activity    Goes to spin class a couple days per week   Lifts weights a couple days per week One night she had a cramp in R arm.  Then flushing across chest   None since   One day at spin class a few months ago the pt says she wa on bike and  felt exhausted   Looked at watch   HR in 69s  Very low for her     She has not had any more episodes like this   Remains very active  No dizziness   Current Meds  Medication Sig   BIOTIN 5000 PO Take 5,000 mcg by mouth daily.   calcium carbonate (OS-CAL) 600 MG TABS tablet Take 600 mg by mouth 2 (two) times daily with a meal.   cholecalciferol (VITAMIN D ) 1000 units tablet Take 1,000 Units by mouth daily.   Cranberry 1000 MG CAPS Take by mouth.   denosumab  (PROLIA ) 60 MG/ML SOSY injection Inject 60 mg into the skin every 6 (six) months.   glucosamine-chondroitin 500-400 MG tablet Take 1 tablet by mouth daily.    levothyroxine (SYNTHROID, LEVOTHROID) 75 MCG tablet Take 75 mcg by mouth daily before breakfast.   timolol (TIMOPTIC) 0.5 % ophthalmic solution Place 1 drop into both eyes 2 (two) times daily.    triamterene-hydrochlorothiazide (DYAZIDE) 37.5-25 MG capsule Take 1 capsule by mouth every other day.   TURMERIC PO Take by mouth.     Allergies:   Lisinopril   Past Medical History:  Diagnosis Date   Arthritis    Cataract    CHEST PAIN-UNSPECIFIED 05/26/2009   Qualifier: Diagnosis of  By: Vivian, RMA, Sherri     Degenerative scoliosis    Glaucoma    Hormone  disorder    Hx of adenomatous polyp of colon 04/2018   no recall due to age   Hypertension    HYPOTHYROIDISM 05/26/2009   Qualifier: Diagnosis of  By: Micky, MD, CODY Reyes Lenis    Osteoporosis    Spinal stenosis     Past Surgical History:  Procedure Laterality Date   arm surgery     metal plate in left arm   CATARACT EXTRACTION, BILATERAL  2020   COLONOSCOPY  2009   repeated 2019 - diminutive adenoma - no recall (age)   TUBAL LIGATION     TYMPANOSTOMY TUBE PLACEMENT       Social History:  The patient  reports that she has never smoked. She has never used smokeless tobacco. She reports current alcohol  use. She reports that she does not use drugs.   Family History:  The patient's family history includes Colon cancer in her maternal aunt; Congestive Heart Failure in her mother; Melanoma in her mother; Stroke in her mother.    ROS:  Please see the history of present illness. All other systems are  reviewed and  Negative to the above problem except as noted.    PHYSICAL EXAM: VS:  BP 132/84   Pulse (!) 54   Ht 5' 1 (1.549 m)   Wt 139 lb 9.6 oz (63.3 kg)   SpO2 99%   BMI 26.38 kg/m   GEN: Well nourished, well developed, in no acute distress  HEENT: normal  Neck: no JVD, no carotid bruits Cardiac: RRR; no murmur Respiratory:  clear to auscultation  GI: soft, nontender,  No hepatomegaly  Ext  NO LE edema  2+ PT pulses   EKG:  EKG is ordered today.  SB  54 bpm     Lipid Panel No results found for: CHOL, TRIG, HDL, CHOLHDL, VLDL, LDLCALC, LDLDIRECT    Wt Readings from Last 3 Encounters:  07/07/24 139 lb 9.6 oz (63.3 kg)  01/02/23 140 lb (63.5 kg)  07/25/22 139 lb (63 kg)      ASSESSMENT AND PLAN:  1 Hx CP    Pt remains CP free   She has not had Ca score   2   BP   BP is well controlled on maxzide   3  Hx dizziness/low HR   One spell   ? If hypervagal that day  No further spells   FOllow   If recurs would set up for monitor and possible  treadmill test   4  LIpids    Will get labs from Dr Rosalene office     Follow up PRN  Current medicines are reviewed at length with the patient today.  The patient does not have concerns regarding medicines.  Signed, Vina Gull, MD   "

## 2024-07-07 ENCOUNTER — Encounter: Payer: Self-pay | Admitting: Internal Medicine

## 2024-07-07 ENCOUNTER — Ambulatory Visit: Attending: Internal Medicine | Admitting: Internal Medicine

## 2024-07-07 VITALS — BP 132/84 | HR 54 | Ht 61.0 in | Wt 139.6 lb

## 2024-07-07 DIAGNOSIS — I10 Essential (primary) hypertension: Secondary | ICD-10-CM

## 2024-07-07 NOTE — Patient Instructions (Signed)
 Medication Instructions:  Your physician recommends that you continue on your current medications as directed. Please refer to the Current Medication list given to you today.  *If you need a refill on your cardiac medications before your next appointment, please call your pharmacy*  Lab Work: NONE If you have labs (blood work) drawn today and your tests are completely normal, you will receive your results only by: MyChart Message (if you have MyChart) OR A paper copy in the mail If you have any lab test that is abnormal or we need to change your treatment, we will call you to review the results.  Testing/Procedures: NONE  Follow-Up: At [facility], you and your health needs are our priority.  As part of our continuing mission to provide you with exceptional heart care, our providers are all part of one team.  This team includes your primary Cardiologist (physician) and Advanced Practice Providers or APPs (Physician Assistants and Nurse Practitioners) who all work together to provide you with the care you need, when you need it.  Your next appointment:   AS NEEDED  We recommend signing up for the patient portal called MyChart.  Sign up information is provided on this After Visit Summary.  MyChart is used to connect with patients for Virtual Visits (Telemedicine).  Patients are able to view lab/test results, encounter notes, upcoming appointments, etc.  Non-urgent messages can be sent to your provider as well.   To learn more about what you can do with MyChart, go to [email or web address].   Other Instructions

## 2024-07-29 ENCOUNTER — Ambulatory Visit: Admitting: Obstetrics and Gynecology
